# Patient Record
Sex: Male | Born: 1990
Health system: Southern US, Community
[De-identification: ages and names within clinical notes are randomized; demographics above are authoritative.]

## PROBLEM LIST (undated history)

## (undated) DIAGNOSIS — F1010 Alcohol abuse, uncomplicated: Secondary | ICD-10-CM

## (undated) DIAGNOSIS — F259 Schizoaffective disorder, unspecified: Secondary | ICD-10-CM

## (undated) DIAGNOSIS — F319 Bipolar disorder, unspecified: Secondary | ICD-10-CM

## (undated) DIAGNOSIS — F419 Anxiety disorder, unspecified: Secondary | ICD-10-CM

---
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
## 2018-05-07 ENCOUNTER — Ambulatory Visit (INDEPENDENT_AMBULATORY_CARE_PROVIDER_SITE_OTHER): Payer: 59 | Admitting: Licensed Clinical Social Worker

## 2018-05-07 DIAGNOSIS — F39 Unspecified mood [affective] disorder: Secondary | ICD-10-CM

## 2018-05-09 NOTE — Progress Notes (Signed)
Comprehensive Clinical Assessment (CCA) Note  05/09/2018 Brady Mckinney [id number]  Visit Diagnosis:      ICD-10-CM   1. Unspecified mood (affective) disorder (HCC) F39       CCA Part One  Part One has been completed on paper by the patient.  (See scanned document in Chart Review)  CCA Part Two A  Intake/Chief Complaint:  CCA Intake With Chief Complaint CCA Part Two Date: 05/07/18 CCA Part Two Time: 1633 Chief Complaint/Presenting Problem: "I don't really know--my mother had bipolar and my father thinks I may too. I've had a rough life." Patients Currently Reported Symptoms/Problems: sleep is restless, poor decision making, possible TBI Collateral Involvement: Sister is present in session Individual's Strengths: Hard working, lots of job experience, "easy to get a job" Individual's Preferences: "I don't think I want to try medications at this time." Individual's Abilities: Able bodied Type of Services Patient Feels Are Needed: "I don't know, probably individual counseling." Initial Clinical Notes/Concerns: Inability to discuss sxs, strong external locus of control  Mental Health Symptoms Depression:  Depression: Difficulty Concentrating, Sleep (too much or little)  Mania:     Anxiety:      Psychosis:     Trauma:     Obsessions:     Compulsions:     Inattention:  Inattention: Disorganized, Forgetful, Poor follow-through on tasks  Hyperactivity/Impulsivity:  Hyperactivity/Impulsivity: Always on the go, Feeling of restlessness, Fidgets with hands/feet, Hard time playing/leisure activities quietly, Talks excessively  Oppositional/Defiant Behaviors:     Borderline Personality:     Other Mood/Personality Symptoms:      Mental Status Exam Appearance and self-care  Stature:  Stature: Average  Weight:  Weight: Average weight  Clothing:  Clothing: Casual, Disheveled  Grooming:  Grooming: Neglected  Cosmetic use:  Cosmetic Use: None  Posture/gait:  Posture/Gait: Normal  Motor  activity:  Motor Activity: Agitated  Sensorium  Attention:  Attention: Distractible  Concentration:  Concentration: Scattered, Variable  Orientation:  Orientation: X5  Recall/memory:  Recall/Memory: Defective in Recent  Affect and Mood  Affect:  Affect: Appropriate  Mood:  Mood: Euthymic, Anxious  Relating  Eye contact:  Eye Contact: Fleeting  Facial expression:  Facial Expression: Constricted, Anxious  Attitude toward examiner:  Attitude Toward Examiner: Cooperative, Silly, Animal nutritionist and Language  Speech flow: Speech Flow: Blocked  Thought content:  Thought Content: Appropriate to mood and circumstances  Preoccupation:     Hallucinations:     Organization:     Company secretary of Knowledge:  Fund of Knowledge: Average  Intelligence:  Intelligence: Average  Abstraction:  Abstraction: Functional  Judgement:  Judgement: Dangerous, Poor  Reality Testing:  Reality Testing: Distorted  Insight:  Insight: Poor  Decision Making:  Decision Making: Impulsive, Confused  Social Functioning  Social Maturity:  Social Maturity: Self-centered, Isolates, Impulsive  Social Judgement:  Social Judgement: Victimized, "Chief of Staff"  Stress  Stressors:  Stressors: Family conflict, Transitions  Coping Ability:  Coping Ability: Deficient supports, Building surveyor Deficits:     Supports:      Family and Psychosocial History: Family history Marital status: Single Are you sexually active?: No What is your sexual orientation?: heterosexual Does patient have children?: No  Childhood History:  Childhood History By whom was/is the patient raised?: Father Additional childhood history information: "Mother was in-and-out of institutions for her severe bipolar since I was around 27yo." Description of patient's relationship with caregiver when they were a child: "Not a good relationship w/ mother, she's too  unpredictable" "Father was fine, we had to move for his job". Patient's  description of current relationship with people who raised him/her: "Don't talk to mother much". "See father occasionally throughout the year". Does patient have siblings?: Yes Number of Siblings: 1 Description of patient's current relationship with siblings: Good, "I'm living w/ my sister now and she is supportive". Did patient suffer any verbal/emotional/physical/sexual abuse as a child?: No Did patient suffer from severe childhood neglect?: Yes Patient description of severe childhood neglect: emotional neglect from mother due to her Bipolar dx Has patient ever been sexually abused/assaulted/raped as an adolescent or adult?: No Was the patient ever a victim of a crime or a disaster?: No Witnessed domestic violence?: No Has patient been effected by domestic violence as an adult?: No  CCA Part Two B  Employment/Work Situation: Employment / Work Situation Employment situation: Employed Where is patient currently employed?: [employer] How long has patient been employed?: "Since I was 27yo". Patient's job has been impacted by current illness: No What is the longest time patient has a held a job?: " A few years". Did You Receive Any Psychiatric Treatment/Services While in the Military?: No Are There Guns or Other Weapons in Your Home?: No  Education: Education School Currently Attending: "Wanting to go to [employer] this Fall" Last Grade Completed: 14 Name of High School: [employer] Did Garment/textile technologist From [employer]?: Yes Did You Attend College?: Yes What Type of College Degree Do you Have?: Associates What Was Your Major?: Food Science Did You Have Any Special Interests In School?: Cooking  Religion: Religion/Spirituality Are You A Religious Person?: No  Leisure/Recreation: Leisure / Recreation Leisure and Hobbies: netflix, video games, music  Exercise/Diet: Exercise/Diet Do You Exercise?: No Have You Gained or Lost A Significant Amount of Weight in the Past Six Months?: No Do You  Follow a Special Diet?: No Do You Have Any Trouble Sleeping?: No  CCA Part Two C  Alcohol/Drug Use: Alcohol / Drug Use History of alcohol / drug use?: Yes Substance #1 Name of Substance 1: Marijuana 1 - Age of First Use: 17 1 - Frequency: near daily 1 - Duration: 2 years 1 - Last Use / Amount: "A few months ago. I've stopped using it as much for my anxiety". Substance #2 Name of Substance 2: Alcohol 2 - Age of First Use: 17 2 - Amount (size/oz): 2-3 standard beers 2 - Frequency: weekly 2 - Duration: last few years 2 - Last Use / Amount: "A few weeks ago".                  CCA Part Three  ASAM's:  Six Dimensions of Multidimensional Assessment  Dimension 1:  Acute Intoxication and/or Withdrawal Potential:     Dimension 2:  Biomedical Conditions and Complications:     Dimension 3:  Emotional, Behavioral, or Cognitive Conditions and Complications:     Dimension 4:  Readiness to Change:     Dimension 5:  Relapse, Continued use, or Continued Problem Potential:     Dimension 6:  Recovery/Living Environment:      Substance use Disorder (SUD)    Social Function:  Social Functioning Social Maturity: Self-centered, Isolates, Impulsive Social Judgement: Victimized, Chemical engineer"  Stress:  Stress Stressors: Family conflict, Transitions Coping Ability: Deficient supports, Overwhelmed Patient Takes Medications The Way The Doctor Instructed?: No Priority Risk: Low Acuity  Risk Assessment- Self-Harm Potential: Risk Assessment For Self-Harm Potential Thoughts of Self-Harm: No current thoughts Method: No plan  Risk Assessment -Dangerous to Others Potential: Risk Assessment For Dangerous to Others Potential Method: No Plan  DSM5 Diagnoses: There are no active problems to display for this patient.   Patient Centered Plan: Patient is on the following Treatment Plan(s):  Impulse Control  Recommendations for Services/Supports/Treatments: Recommendations for  Services/Supports/Treatments Recommendations For Services/Supports/Treatments: Individual Therapy  Treatment Plan Summary:    Referrals to Alternative Service(s): Referred to Alternative Service(s):   Place:   Date:   Time:    Referred to Alternative Service(s):   Place:   Date:   Time:    Referred to Alternative Service(s):   Place:   Date:   Time:    Referred to Alternative Service(s):   Place:   Date:   Time:     Margo Common

## 2018-06-03 ENCOUNTER — Ambulatory Visit (INDEPENDENT_AMBULATORY_CARE_PROVIDER_SITE_OTHER): Payer: 59 | Admitting: Licensed Clinical Social Worker

## 2018-06-03 DIAGNOSIS — F39 Unspecified mood [affective] disorder: Secondary | ICD-10-CM

## 2018-06-04 ENCOUNTER — Encounter (HOSPITAL_COMMUNITY): Payer: Self-pay | Admitting: Licensed Clinical Social Worker
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
## 2018-07-01 ENCOUNTER — Ambulatory Visit (INDEPENDENT_AMBULATORY_CARE_PROVIDER_SITE_OTHER): Payer: Self-pay | Admitting: Licensed Clinical Social Worker

## 2018-07-01 DIAGNOSIS — F39 Unspecified mood [affective] disorder: Secondary | ICD-10-CM

## 2018-07-03 NOTE — Progress Notes (Signed)
Pt did not show for appointment. 

## 2019-01-16 ENCOUNTER — Other Ambulatory Visit: Payer: Self-pay

## 2019-01-16 ENCOUNTER — Encounter (HOSPITAL_COMMUNITY): Payer: Self-pay

## 2019-01-16 ENCOUNTER — Emergency Department (HOSPITAL_COMMUNITY)
Admission: EM | Admit: 2019-01-16 | Discharge: 2019-01-17 | Disposition: A | Discharge status: Other Acute Inpatient Hospital | Payer: Medicaid Other | Attending: Emergency Medicine | Admitting: Emergency Medicine

## 2019-01-16 DIAGNOSIS — F1220 Cannabis dependence, uncomplicated: Secondary | ICD-10-CM | POA: Insufficient documentation

## 2019-01-16 DIAGNOSIS — F1020 Alcohol dependence, uncomplicated: Secondary | ICD-10-CM | POA: Insufficient documentation

## 2019-01-16 DIAGNOSIS — F332 Major depressive disorder, recurrent severe without psychotic features: Secondary | ICD-10-CM | POA: Insufficient documentation

## 2019-01-16 DIAGNOSIS — R45851 Suicidal ideations: Secondary | ICD-10-CM

## 2019-01-16 DIAGNOSIS — F17210 Nicotine dependence, cigarettes, uncomplicated: Secondary | ICD-10-CM | POA: Insufficient documentation

## 2019-01-16 DIAGNOSIS — F10920 Alcohol use, unspecified with intoxication, uncomplicated: Secondary | ICD-10-CM

## 2019-01-16 HISTORY — DX: Bipolar disorder, unspecified: F31.9

## 2019-01-16 HISTORY — DX: Anxiety disorder, unspecified: F41.9

## 2019-01-16 HISTORY — DX: Schizoaffective disorder, unspecified: F25.9

## 2019-01-16 LAB — COMPREHENSIVE METABOLIC PANEL
ALK PHOS: 86 U/L (ref 38–126)
ALT: 36 U/L (ref 0–44)
ANION GAP: 11 (ref 5–15)
AST: 61 U/L — ABNORMAL HIGH (ref 15–41)
Albumin: 4.8 g/dL (ref 3.5–5.0)
BUN: 5 mg/dL — ABNORMAL LOW (ref 6–20)
CALCIUM: 9 mg/dL (ref 8.9–10.3)
CHLORIDE: 103 mmol/L (ref 98–111)
CO2: 26 mmol/L (ref 22–32)
CREATININE: 0.77 mg/dL (ref 0.61–1.24)
Glucose, Bld: 179 mg/dL — ABNORMAL HIGH (ref 70–99)
Potassium: 3.7 mmol/L (ref 3.5–5.1)
SODIUM: 140 mmol/L (ref 135–145)
Total Bilirubin: 0.5 mg/dL (ref 0.3–1.2)
Total Protein: 8 g/dL (ref 6.5–8.1)

## 2019-01-16 LAB — CBC
HCT: 51.2 % (ref 39.0–52.0)
Hemoglobin: 16.6 g/dL (ref 13.0–17.0)
MCH: 32.2 pg (ref 26.0–34.0)
MCHC: 32.4 g/dL (ref 30.0–36.0)
MCV: 99.2 fL (ref 80.0–100.0)
Platelets: 288 10*3/uL (ref 150–400)
RBC: 5.16 MIL/uL (ref 4.22–5.81)
RDW: 13.2 % (ref 11.5–15.5)
WBC: 9.3 10*3/uL (ref 4.0–10.5)
nRBC: 0 % (ref 0.0–0.2)

## 2019-01-16 LAB — RAPID URINE DRUG SCREEN, HOSP PERFORMED
Amphetamines: NOT DETECTED
Barbiturates: NOT DETECTED
Benzodiazepines: NOT DETECTED
Cocaine: NOT DETECTED
OPIATES: NOT DETECTED
Tetrahydrocannabinol: POSITIVE — AB

## 2019-01-16 LAB — ETHANOL: ALCOHOL ETHYL (B): 453 mg/dL — AB (ref <10)

## 2019-01-16 LAB — ACETAMINOPHEN LEVEL

## 2019-01-16 LAB — SALICYLATE LEVEL

## 2019-01-16 MED ORDER — LORAZEPAM 1 MG PO TABS: 0.0000 mg | ORAL_TABLET | Freq: Four times a day (QID) | ORAL | Status: DC

## 2019-01-16 MED ORDER — VITAMIN B-1 100 MG PO TABS: 100.0000 mg | ORAL_TABLET | Freq: Every day | ORAL | Status: DC

## 2019-01-16 MED ORDER — LORAZEPAM 2 MG/ML IJ SOLN: 0.0000 mg | Freq: Two times a day (BID) | INTRAMUSCULAR | Status: DC

## 2019-01-16 MED ORDER — THIAMINE HCL 100 MG/ML IJ SOLN: 100.0000 mg | Freq: Every day | INTRAMUSCULAR | Status: DC

## 2019-01-16 MED ORDER — LORAZEPAM 1 MG PO TABS: 0.0000 mg | ORAL_TABLET | Freq: Two times a day (BID) | ORAL | Status: DC

## 2019-01-16 MED ORDER — LORAZEPAM 2 MG/ML IJ SOLN: 0.0000 mg | Freq: Four times a day (QID) | INTRAMUSCULAR | Status: DC

## 2019-01-16 NOTE — ED Provider Notes (Addendum)
[facility]-EMERGENCY DEPT Provider Note   CSN: [id number] Arrival date & time: 01/16/19  1830     History   Chief Complaint Chief Complaint  Patient presents with  . Suicidal    HPI Brady Mckinney is a 28 y.o. male.  HPI Patient presents with suicidal thoughts.  States he is close to doing it.  States he has been suicidal since he was 28 years old.  Has had previous attempts.  States he has been off his psychiatric medicine.  States is been a bunch from before but they were not working.  Denies substance abuse.  States he will drink 140 ounce beers a week.  However he does smell of alcohol at this time.  Patient is homeless and has been out in the rain.  Patient has several different plans to kill himself.  States he is worried that he will do it. Past Medical History:  Diagnosis Date  . Anxiety   . Bipolar 1 disorder (HCC)   . Schizoaffective disorder [facility](HCC)     Patient Active Problem List   Diagnosis Date Noted  . MDD (major depressive disorder), severe (HCC) 01/17/2019    History reviewed. No pertinent surgical history.      Home Medications    Prior to Admission medications   Not on File    Family History Family History  Problem Relation Age of Onset  . Bipolar disorder Mother   . Schizophrenia Father     Social History Social History   Tobacco Use  . Smoking status: Current Every Day Smoker    Packs/day: 0.45    Types: Cigarettes  . Smokeless tobacco: Never Used  Substance Use Topics  . Alcohol use: Yes    Comment: daily   . Drug use: Never     Allergies   Patient has no known allergies.   Review of Systems Review of Systems  Constitutional: Positive for appetite change.  HENT: Negative for congestion.   Respiratory: Negative for shortness of breath.   Cardiovascular: Negative for chest pain.  Gastrointestinal: Negative for abdominal distention.  Genitourinary: Negative for flank pain.  Musculoskeletal: Negative  for back pain.  Skin: Negative for rash.  Neurological: Negative for weakness.  Psychiatric/Behavioral: Positive for suicidal ideas.     Physical Exam Updated Vital Signs BP 119/81 (BP Location: Left Arm)   Pulse (!) 101   Temp 97.7 F (36.5 C) (Oral)   Resp 18   Ht 5\' 8"  (1.727 m)   Wt 65.8 kg   SpO2 100%   BMI 22.05 kg/m   Physical Exam Vitals signs and nursing note reviewed.  HENT:     Head: Normocephalic.  Eyes:     Extraocular Movements: Extraocular movements intact.  Cardiovascular:     Rate and Rhythm: Regular rhythm.     Comments: Mild tachycardia Pulmonary:     Effort: Pulmonary effort is normal.  Abdominal:     Tenderness: There is no abdominal tenderness.  Skin:    Capillary Refill: Capillary refill takes less than 2 seconds.     Comments: Patient's feet appears if they have been wet for a while.  No clear infections.  Neurological:     Comments: Patient appears intoxicated.  Mildly slurred speech.  Psychiatric:     Comments: Patient appears intoxicated.      ED Treatments / Results  Labs (all labs ordered are listed, but only abnormal results are displayed) Labs Reviewed  COMPREHENSIVE METABOLIC PANEL - Abnormal; Notable for  the following components:      Result Value   Glucose, Bld 179 (*)    BUN 5 (*)    AST 61 (*)    All other components within normal limits  ETHANOL - Abnormal; Notable for the following components:   Alcohol, Ethyl (B) 453 (*)    All other components within normal limits  ACETAMINOPHEN LEVEL - Abnormal; Notable for the following components:   Acetaminophen (Tylenol), Serum <10 (*)    All other components within normal limits  RAPID URINE DRUG SCREEN, HOSP PERFORMED - Abnormal; Notable for the following components:   Tetrahydrocannabinol POSITIVE (*)    All other components within normal limits  SALICYLATE LEVEL  CBC    EKG None  Radiology No results found.  Procedures Procedures (including critical care  time)  Medications Ordered in ED Medications - No data to display   Initial Impression / Assessment and Plan / ED Course  I have reviewed the triage vital signs and the nursing notes.  Pertinent labs & imaging results that were available during my care of the patient were reviewed by me and considered in my medical decision making (see chart for details).  Clinical Course as of Jan 21 708  Fri Jan 17, 2019  0043 [facility]   [MB]    Clinical Course User Index [MB] Sabas SousBero, Michael M, MD    Patient with suicidal thoughts.  Psychiatric history and office medicines.  States he only drinks one 40 ounce beer a week but alcohol level is 450.  Besides the alcohol level he is medically cleared.  To be seen by TTS.  Final Clinical Impressions(s) / ED Diagnoses   Final diagnoses:  Suicidal ideation  Alcoholic intoxication without complication [facility](HCC)    ED Discharge Orders    None       Benjiman CorePickering, Simcha Speir, MD 01/16/19 2134    Benjiman CorePickering, Carley Glendenning, MD 01/17/19 1530    Benjiman CorePickering, Cayson Kalb, MD 01/20/19 [id number]

## 2019-01-16 NOTE — ED Notes (Signed)
TTS at bedside. 

## 2019-01-16 NOTE — ED Notes (Signed)
Bed: [facility]4 Expected date:  Expected time:  Means of arrival:  Comments: 

## 2019-01-16 NOTE — ED Triage Notes (Addendum)
Patient states he is suicidal. Patient states his plan is to jump off of a building, jumping out of a plane with no parachute, or causing myself to get run over by truck tires. Patient states, "I do not want to do these things. I am looking for help." Patient denies any HI, drug use. Patient states he has visualizations of sexual things or actually killing himself. Patient states he drank a 40 ounce beer at 1400 today.

## 2019-01-17 ENCOUNTER — Other Ambulatory Visit: Payer: Self-pay

## 2019-01-17 ENCOUNTER — Inpatient Hospital Stay (HOSPITAL_COMMUNITY)
Admission: AD | Admit: 2019-01-17 | Discharge: 2019-01-23 | DRG: 885 | Disposition: A | Discharge status: Home / Self Care | Payer: Medicaid Other | Source: Intra-hospital | Attending: Psychiatry | Admitting: Psychiatry

## 2019-01-17 ENCOUNTER — Encounter (HOSPITAL_COMMUNITY): Payer: Self-pay | Admitting: Behavioral Health

## 2019-01-17 DIAGNOSIS — F3181 Bipolar II disorder: Secondary | ICD-10-CM | POA: Diagnosis not present

## 2019-01-17 DIAGNOSIS — Y908 Blood alcohol level of 240 mg/100 ml or more: Secondary | ICD-10-CM | POA: Diagnosis present

## 2019-01-17 DIAGNOSIS — F17210 Nicotine dependence, cigarettes, uncomplicated: Secondary | ICD-10-CM | POA: Diagnosis present

## 2019-01-17 DIAGNOSIS — Z818 Family history of other mental and behavioral disorders: Secondary | ICD-10-CM | POA: Diagnosis not present

## 2019-01-17 DIAGNOSIS — R45851 Suicidal ideations: Secondary | ICD-10-CM | POA: Diagnosis present

## 2019-01-17 DIAGNOSIS — F319 Bipolar disorder, unspecified: Secondary | ICD-10-CM | POA: Diagnosis present

## 2019-01-17 DIAGNOSIS — F10239 Alcohol dependence with withdrawal, unspecified: Secondary | ICD-10-CM | POA: Diagnosis present

## 2019-01-17 DIAGNOSIS — F259 Schizoaffective disorder, unspecified: Secondary | ICD-10-CM | POA: Diagnosis present

## 2019-01-17 DIAGNOSIS — Z590 Homelessness: Secondary | ICD-10-CM | POA: Diagnosis not present

## 2019-01-17 DIAGNOSIS — F1024 Alcohol dependence with alcohol-induced mood disorder: Secondary | ICD-10-CM

## 2019-01-17 DIAGNOSIS — F1010 Alcohol abuse, uncomplicated: Secondary | ICD-10-CM

## 2019-01-17 DIAGNOSIS — G4700 Insomnia, unspecified: Secondary | ICD-10-CM | POA: Diagnosis present

## 2019-01-17 DIAGNOSIS — F322 Major depressive disorder, single episode, severe without psychotic features: Secondary | ICD-10-CM | POA: Diagnosis present

## 2019-01-17 MED ORDER — LORAZEPAM 1 MG PO TABS
1.0000 mg | ORAL_TABLET | Freq: Two times a day (BID) | ORAL | Status: AC
  Administered 2019-01-19 (×2): 1 mg via ORAL
  Filled 2019-01-17 (×2): qty 1

## 2019-01-17 MED ORDER — LORAZEPAM 1 MG PO TABS
1.0000 mg | ORAL_TABLET | Freq: Every day | ORAL | Status: AC
  Administered 2019-01-20: 1 mg via ORAL
  Filled 2019-01-17: qty 1

## 2019-01-17 MED ORDER — LORAZEPAM 1 MG PO TABS
1.0000 mg | ORAL_TABLET | Freq: Four times a day (QID) | ORAL | Status: AC
  Administered 2019-01-17 (×3): 1 mg via ORAL
  Filled 2019-01-17 (×3): qty 1

## 2019-01-17 MED ORDER — NICOTINE 21 MG/24HR TD PT24
21.0000 mg | MEDICATED_PATCH | TRANSDERMAL | Status: DC
  Administered 2019-01-18: 21 mg via TRANSDERMAL
  Filled 2019-01-17 (×3): qty 1

## 2019-01-17 MED ORDER — THIAMINE HCL 100 MG/ML IJ SOLN: 100.0000 mg | Freq: Once | INTRAMUSCULAR | Status: DC

## 2019-01-17 MED ORDER — TRAZODONE HCL 50 MG PO TABS
50.0000 mg | ORAL_TABLET | Freq: Every evening | ORAL | Status: DC | PRN
  Filled 2019-01-17 (×4): qty 1

## 2019-01-17 MED ORDER — LOPERAMIDE HCL 2 MG PO CAPS: 2.0000 mg | ORAL_CAPSULE | ORAL | Status: AC | PRN

## 2019-01-17 MED ORDER — HYDROXYZINE HCL 25 MG PO TABS
25.0000 mg | ORAL_TABLET | Freq: Four times a day (QID) | ORAL | Status: AC | PRN
  Administered 2019-01-17 – 2019-01-19 (×4): 25 mg via ORAL
  Filled 2019-01-17 (×4): qty 1

## 2019-01-17 MED ORDER — MAGNESIUM HYDROXIDE 400 MG/5ML PO SUSP: 30.0000 mL | Freq: Every day | ORAL | Status: DC | PRN

## 2019-01-17 MED ORDER — VITAMIN B-1 100 MG PO TABS
100.0000 mg | ORAL_TABLET | Freq: Every day | ORAL | Status: DC
  Administered 2019-01-18 – 2019-01-23 (×6): 100 mg via ORAL
  Filled 2019-01-17 (×8): qty 1

## 2019-01-17 MED ORDER — ACETAMINOPHEN 325 MG PO TABS: 650.0000 mg | ORAL_TABLET | Freq: Four times a day (QID) | ORAL | Status: DC | PRN

## 2019-01-17 MED ORDER — LORAZEPAM 1 MG PO TABS
1.0000 mg | ORAL_TABLET | Freq: Four times a day (QID) | ORAL | Status: AC | PRN
  Administered 2019-01-19: 1 mg via ORAL
  Filled 2019-01-17: qty 1

## 2019-01-17 MED ORDER — IBUPROFEN 600 MG PO TABS
600.0000 mg | ORAL_TABLET | Freq: Four times a day (QID) | ORAL | Status: DC | PRN
  Administered 2019-01-17 – 2019-01-22 (×4): 600 mg via ORAL
  Filled 2019-01-17 (×4): qty 1

## 2019-01-17 MED ORDER — LORAZEPAM 1 MG PO TABS
1.0000 mg | ORAL_TABLET | Freq: Three times a day (TID) | ORAL | Status: AC
  Administered 2019-01-18 (×3): 1 mg via ORAL
  Filled 2019-01-17 (×3): qty 1

## 2019-01-17 MED ORDER — ADULT MULTIVITAMIN W/MINERALS CH
1.0000 | ORAL_TABLET | Freq: Every day | ORAL | Status: DC
  Administered 2019-01-17 – 2019-01-23 (×7): 1 via ORAL
  Filled 2019-01-17 (×10): qty 1

## 2019-01-17 MED ORDER — TRAZODONE HCL 50 MG PO TABS
50.0000 mg | ORAL_TABLET | Freq: Every evening | ORAL | Status: DC | PRN
  Administered 2019-01-17 – 2019-01-21 (×5): 50 mg via ORAL
  Filled 2019-01-17 (×4): qty 1

## 2019-01-17 MED ORDER — ALUM & MAG HYDROXIDE-SIMETH 200-200-20 MG/5ML PO SUSP: 30.0000 mL | ORAL | Status: DC | PRN

## 2019-01-17 MED ORDER — ONDANSETRON 4 MG PO TBDP
4.0000 mg | ORAL_TABLET | Freq: Four times a day (QID) | ORAL | Status: AC | PRN
  Administered 2019-01-18 – 2019-01-19 (×2): 4 mg via ORAL
  Filled 2019-01-17 (×3): qty 1

## 2019-01-17 MED ORDER — CARBAMAZEPINE ER 200 MG PO TB12
200.0000 mg | ORAL_TABLET | Freq: Two times a day (BID) | ORAL | Status: DC
  Administered 2019-01-17 – 2019-01-20 (×6): 200 mg via ORAL
  Filled 2019-01-17 (×12): qty 1

## 2019-01-17 NOTE — ED Notes (Signed)
Attempted report. Patrice to [employer]
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
## 2019-01-17 NOTE — BHH Suicide Risk Assessment (Signed)
[facility] Admission Suicide Risk Assessment   Nursing information obtained from:  Patient Demographic factors:  Male, Caucasian, Low socioeconomic status, Unemployed Current Mental Status:  Suicidal ideation indicated by patient, Suicide plan Loss Factors:  Decrease in vocational status Historical Factors:  Prior suicide attempts, Impulsivity Risk Reduction Factors:  NA  Total Time spent with patient: 45 minutes Principal Problem:  Bipolar Disorder Depressed, versus Alcohol Induced Mood Disorder, Alcohol Dependence  Diagnosis:  Active Problems:   MDD (major depressive disorder), severe (HCC)  Subjective Data:   Continued Clinical Symptoms:  Alcohol Use Disorder Identification Test Final Score (AUDIT): 10 The "Alcohol Use Disorders Identification Test", Guidelines for Use in Primary Care, Second Edition.  World Science writer [facility]). Score between 0-7:  no or low risk or alcohol related problems. Score between 8-15:  moderate risk of alcohol related problems. Score between 16-19:  high risk of alcohol related problems. Score 20 or above:  warrants further diagnostic evaluation for alcohol dependence and treatment.   CLINICAL FACTORS:  28 year old male, presented to ED voluntarily due to worsening depression, suicidal ideations, neuro-vegetative symptoms of depression. Also reported daily drinking over the last few weeks and admission BAL was 453. He reports history of Bipolar Disorder and does endorse past history of depressive and hypomanic episodes , as well as family history of Bipolarity ( mother).   Psychiatric Specialty Exam: Physical Exam  ROS  Blood pressure 120/74, pulse 99, temperature 98.2 F (36.8 C), temperature source Oral, resp. rate 16, height 5\' 8"  (1.727 m), weight 65.8 kg, SpO2 98 %.Body mass index is 22.05 kg/m.  See admit note MSE                                                        COGNITIVE FEATURES THAT CONTRIBUTE TO RISK:   Closed-mindedness and Loss of executive function    SUICIDE RISK:   Moderate:  Frequent suicidal ideation with limited intensity, and duration, some specificity in terms of plans, no associated intent, good self-control, limited dysphoria/symptomatology, some risk factors present, and identifiable protective factors, including available and accessible social support.  PLAN OF CARE: Patient will be admitted to inpatient psychiatric unit for stabilization and safety. Will provide and encourage milieu participation. Provide medication management and maked adjustments as needed. Will also provide medication management to address potential alcohol WDL.  Will follow daily.    I certify that inpatient services furnished can reasonably be expected to improve the patient's condition.   Craige Cotta, MD 01/17/2019, 4:43 PM

## 2019-01-17 NOTE — Progress Notes (Signed)
D: Pt was in dayroom upon initial approach.  Pt presents with anxious, depressed affect and mood.  He describes his day as "decent" and reports his goal is to "try to sleep."  Pt denies HI, denies hallucinations, reports pain from headache of 3/10.  He reports passive SI without a plan.  Pt verbally contracts for safety.  Pt has been visible in milieu interacting with peers and staff appropriately.  Pt attended evening group.    A: Introduced self to pt.  Actively listened to pt and offered support and encouragement.  Medications administered per order.  PRN medication administered for anxiety, pain, and sleep.  Q15 minute safety checks maintained.  R: Pt is safe on the unit.  Pt is compliant with medications.  Pt verbally contracts for safety.  Will continue to monitor and assess.

## 2019-01-17 NOTE — ED Notes (Signed)
Pt transported via [facility] to [facility] with belongings.

## 2019-01-17 NOTE — Tx Team (Signed)
Initial Treatment Plan 01/17/2019 11:07 AM Brady Mckinney KDX:[id number]    PATIENT STRESSORS: Legal issue Substance abuse   PATIENT STRENGTHS: Ability for insight Active sense of humor Average or above average intelligence   PATIENT IDENTIFIED PROBLEMS: "thoughts to attempt suicide"  "employent concerns due to mental health"  "drinking alcohol excessively"                 DISCHARGE CRITERIA:  Ability to meet basic life and health needs Adequate post-discharge living arrangements Improved stabilization in mood, thinking, and/or behavior  PRELIMINARY DISCHARGE PLAN: Attend aftercare/continuing care group Attend PHP/IOP  PATIENT/FAMILY INVOLVEMENT: This treatment plan has been presented to and reviewed with the patient, Brady Mckinney, and/or family member.  The patient and family have been given the opportunity to ask questions and make suggestions.  Layla Barter, RN 01/17/2019, 11:07 AM

## 2019-01-17 NOTE — ED Notes (Signed)
Bed: [facility] Expected date:  Expected time:  Means of arrival:  Comments: 

## 2019-01-17 NOTE — ED Notes (Signed)
Pt seen and wand by security.  Pt has belongings at  Triage desk.

## 2019-01-17 NOTE — H&P (Signed)
Psychiatric Admission Assessment Adult  Patient Identification: Brady Mckinney MRN:  [id number] Date of Evaluation:  01/17/2019 Chief Complaint:  " I really felt like killing myself " Principal Diagnosis: MDD Diagnosis:  Active Problems:   MDD (major depressive disorder), severe (HCC)  History of Present Illness: 28 year old male, presented to ED voluntarily on 2/6, reporting worsening depression, feeling hopeless, and having suicidal ideations, with thoughts of jumping in front of a truck. He reports history of intermittent depression since childhood, but states he has been more depressed over the last few weeks, and states he has been facing some significant stressors over recent weeks- homelessness, recent break up, losing  job recently, recent anniversary of prior GF's death , who died by overdose 2 year ago. He has also been drinking daily, usually about 4 beers ( 48 ounces ) per day.  Admission BAL 453, admission UDS (+) for Cannabis Endorses neuro-vegetative symptoms as below Associated Signs/Symptoms: Depression Symptoms:  depressed mood, anhedonia, insomnia, suicidal thoughts with specific plan, anxiety, loss of energy/fatigue, decreased appetite, reports he has lost about 20 lbs over the last 2 months (Hypo) Manic Symptoms:  Does not endorse or present with at this time Anxiety Symptoms:  Reports increased anxiety, worry, which he attributes to recent losses as above  Psychotic Symptoms:  Denies  PTSD Symptoms: Does not endorse  Total Time spent with patient: 45 minutes  Past Psychiatric History:  No prior psychiatric admissions, history of prior suicide attempts as teenager by overdosing , walking into traffic, most recently at age 25. Reports prior history of Bipolar Disorder and reports history of intermittent depressive episodes and brief episodes of feeling more energetic, jovial, feeling hypersexual and more sociable, decreased need for sleep. Denies history of panic  or agoraphobia. Denies history of psychosis Denies history of violence .  Is the patient at risk to self? Yes.    Has the patient been a risk to self in the past 6 months? Yes.    Has the patient been a risk to self within the distant past? Yes.    Is the patient a risk to others? No.  Has the patient been a risk to others in the past 6 months? No.  Has the patient been a risk to others within the distant past? No.   Prior Inpatient Therapy:  as above  Prior Outpatient Therapy:  no current outpatient treatment   Alcohol Screening: 1. How often do you have a drink containing alcohol?: 2 to 3 times a week 2. How many drinks containing alcohol do you have on a typical day when you are drinking?: 3 or 4 3. How often do you have six or more drinks on one occasion?: Monthly AUDIT-C Score: 6 4. How often during the last year have you found that you were not able to stop drinking once you had started?: Never 5. How often during the last year have you failed to do what was normally expected from you becasue of drinking?: Never 6. How often during the last year have you needed a first drink in the morning to get yourself going after a heavy drinking session?: Never 7. How often during the last year have you had a feeling of guilt of remorse after drinking?: Never 8. How often during the last year have you been unable to remember what happened the night before because you had been drinking?: Never 9. Have you or someone else been injured as a result of your drinking?: No 10. Has  a relative or friend or a doctor or another health worker been concerned about your drinking or suggested you cut down?: Yes, during the last year Alcohol Use Disorder Identification Test Final Score (AUDIT): 10 Alcohol Brief Interventions/Follow-up: Alcohol Education Substance Abuse History in the last 12 months:  Reports he has been drinking regularly, particularly over the last several weeks following a break up. Has been  drinking daily, up to 4 beers per day. Denies drug abuse .  Consequences of Substance Abuse: History of DUI in 2014. Denies history of seizures, reports past history of blackouts, none recent . Previous Psychotropic Medications:reports he was prescribed Seroquel in the past, " because they thought I had Bipolar", but states " it made me feel worse". He also remembers Depakote ( did not work) and Abilify.  Has not been on any psychiatric medications in more than a year . Psychological Evaluations: No  Past Medical History: Denies medical illnesses, NKDA.  Past Medical History:  Diagnosis Date  . Anxiety   . Bipolar 1 disorder (HCC)   . Schizoaffective disorder (HCC)    History reviewed. No pertinent surgical history. Family History: Parents alive, divorced when patient was very young. Has one older sister  Family Psychiatric  History: Patient states mother has been diagnosed with Bipolar Disorder and with Generalized Anxiety Disorder in the past. He has a maternal uncle who is alcoholic. No suicides in family . Of note, states that his mother has reportedly done very well on Tegretol .  Tobacco Screening: Have you used any form of tobacco in the last 30 days? (Cigarettes, Smokeless Tobacco, Cigars, and/or Pipes): Yes Tobacco use, Select all that apply: 5 or more cigarettes per day Are you interested in Tobacco Cessation Medications?: Yes, will notify MD for an order Counseled patient on smoking cessation including recognizing danger situations, developing coping skills and basic information about quitting provided: Yes Social History: single, no children, currently homeless, reports recent break up with GF. Currently unemployed. Reports recent court case for open container charge . Social History   Substance and Sexual Activity  Alcohol Use Yes   Comment: daily      Social History   Substance and Sexual Activity  Drug Use Never    Additional Social History:  Allergies:  No Known  Allergies Lab Results:  Results for orders placed or performed during the hospital encounter of 01/16/19 (from the past 48 hour(s))  Comprehensive metabolic panel     Status: Abnormal   Collection Time: 01/16/19  7:33 PM  Result Value Ref Range   Sodium 140 135 - 145 mmol/L   Potassium 3.7 3.5 - 5.1 mmol/L   Chloride 103 98 - 111 mmol/L   CO2 26 22 - 32 mmol/L   Glucose, Bld 179 (H) 70 - 99 mg/dL   BUN 5 (L) 6 - 20 mg/dL   Creatinine, Ser 4.09 0.61 - 1.24 mg/dL   Calcium 9.0 8.9 - 81.1 mg/dL   Total Protein 8.0 6.5 - 8.1 g/dL   Albumin 4.8 3.5 - 5.0 g/dL   AST 61 (H) 15 - 41 U/L   ALT 36 0 - 44 U/L   Alkaline Phosphatase 86 38 - 126 U/L   Total Bilirubin 0.5 0.3 - 1.2 mg/dL   GFR calc non Af Amer >60 >60 mL/min   GFR calc Af Amer >60 >60 mL/min   Anion gap 11 5 - 15    Comment: Performed at [facility], [address] [place], [place]  [id number]  cbc     Status: None   Collection Time: 01/16/19  7:33 PM  Result Value Ref Range   WBC 9.3 4.0 - 10.5 K/uL   RBC 5.16 4.22 - 5.81 MIL/uL   Hemoglobin 16.6 13.0 - 17.0 g/dL   HCT 95.151.2 88.439.0 - 16.652.0 %   MCV 99.2 80.0 - 100.0 fL   MCH 32.2 26.0 - 34.0 pg   MCHC 32.4 30.0 - 36.0 g/dL   RDW 06.313.2 01.611.5 - 01.015.5 %   Platelets 288 150 - 400 K/uL   nRBC 0.0 0.0 - 0.2 %    Comment: Performed at [facility], [address] [place], [place] [address]  Ethanol     Status: Abnormal   Collection Time: 01/16/19  7:34 PM  Result Value Ref Range   Alcohol, Ethyl (B) 453 (HH) <10 mg/dL    Comment: CRITICAL RESULT CALLED TO, READ BACK BY AND VERIFIED WITH: Talbert NanJ OXENDINE RN 2007 01/16/19 A NAVARRO (NOTE) Lowest detectable limit for serum alcohol is 10 mg/dL. For medical purposes only. Performed at [facility], [address] [place], [place] [address]   Salicylate level     Status: None   Collection Time: 01/16/19  7:34 PM  Result Value Ref Range   Salicylate Lvl <7.0 2.8 - 30.0 mg/dL     Comment: Performed at [facility], [address] [place], [place] [address]  Acetaminophen level     Status: Abnormal   Collection Time: 01/16/19  7:34 PM  Result Value Ref Range   Acetaminophen (Tylenol), Serum <10 (L) 10 - 30 ug/mL    Comment: (NOTE) Therapeutic concentrations vary significantly. A range of 10-30 ug/mL  may be an effective concentration for many patients. However, some  are best treated at concentrations outside of this range. Acetaminophen concentrations >150 ug/mL at 4 hours after ingestion  and >50 ug/mL at 12 hours after ingestion are often associated with  toxic reactions. Performed at [facility], [address] [place], [place] [address]   Rapid urine drug screen (hospital performed)     Status: Abnormal   Collection Time: 01/16/19  8:41 PM  Result Value Ref Range   Opiates NONE DETECTED NONE DETECTED   Cocaine NONE DETECTED NONE DETECTED   Benzodiazepines NONE DETECTED NONE DETECTED   Amphetamines NONE DETECTED NONE DETECTED   Tetrahydrocannabinol POSITIVE (A) NONE DETECTED   Barbiturates NONE DETECTED NONE DETECTED    Comment: (NOTE) DRUG SCREEN FOR MEDICAL PURPOSES ONLY.  IF CONFIRMATION IS NEEDED FOR ANY PURPOSE, NOTIFY LAB WITHIN 5 DAYS. LOWEST DETECTABLE LIMITS FOR URINE DRUG SCREEN Drug Class                     Cutoff (ng/mL) Amphetamine and metabolites    1000 Barbiturate and metabolites    200 Benzodiazepine                 200 Tricyclics and metabolites     300 Opiates and metabolites        300 Cocaine and metabolites        300 THC                            50 Performed at [facility], [address] [place], [place] [address]     Blood Alcohol level:  Lab Results  Component Value Date   ETH 453 [facility](HH)  01/16/2019    Metabolic Disorder Labs:  No results found for: HGBA1C, MPG No results found for: PROLACTIN No results found for: CHOL, TRIG, HDL, CHOLHDL, VLDL,  LDLCALC  Current Medications: Current Facility-Administered Medications  Medication Dose Route Frequency Provider Last Rate Last Dose  . acetaminophen (TYLENOL) tablet 650 mg  650 mg Oral Q6H PRN Kerry Hough, PA-C      . alum & mag hydroxide-simeth (MAALOX/MYLANTA) 200-200-20 MG/5ML suspension 30 mL  30 mL Oral Q4H PRN Kerry Hough, PA-C      . hydrOXYzine (ATARAX/VISTARIL) tablet 25 mg  25 mg Oral Q6H PRN Donell Sievert E, PA-C      . loperamide (IMODIUM) capsule 2-4 mg  2-4 mg Oral PRN Kerry Hough, PA-C      . LORazepam (ATIVAN) tablet 1 mg  1 mg Oral Q6H PRN Kerry Hough, PA-C      . LORazepam (ATIVAN) tablet 1 mg  1 mg Oral QID Donell Sievert E, PA-C   1 mg at 01/17/19 1140   Followed by  . [START ON 01/18/2019] LORazepam (ATIVAN) tablet 1 mg  1 mg Oral TID Kerry Hough, PA-C       Followed by  . [START ON 01/19/2019] LORazepam (ATIVAN) tablet 1 mg  1 mg Oral BID Donell Sievert E, PA-C       Followed by  . [START ON 01/20/2019] LORazepam (ATIVAN) tablet 1 mg  1 mg Oral Daily Simon, Spencer E, PA-C      . magnesium hydroxide (MILK OF MAGNESIA) suspension 30 mL  30 mL Oral Daily PRN Kerry Hough, PA-C      . multivitamin with minerals tablet 1 tablet  1 tablet Oral Daily Kerry Hough, PA-C   1 tablet at 01/17/19 1140  . ondansetron (ZOFRAN-ODT) disintegrating tablet 4 mg  4 mg Oral Q6H PRN Donell Sievert E, PA-C      . thiamine (B-1) injection 100 mg  100 mg Intramuscular Once Kerry Hough, PA-C      . [START ON 01/18/2019] thiamine (VITAMIN B-1) tablet 100 mg  100 mg Oral Daily Simon, Spencer E, PA-C      . traZODone (DESYREL) tablet 50 mg  50 mg Oral QHS,MR X 1 Simon, Spencer E, PA-C       PTA Medications: No medications prior to admission.    Musculoskeletal: Strength & Muscle Tone: within normal limits minimal tremors, no diaphoresis, no psychomotor agitation Gait & Station: normal Patient leans: N/A  Psychiatric Specialty Exam: Physical Exam   Review of Systems  Constitutional: Positive for weight loss.  HENT: Negative.   Eyes: Negative.   Respiratory: Negative.   Cardiovascular: Negative.   Gastrointestinal: Positive for nausea and vomiting.  Genitourinary: Negative.   Musculoskeletal: Negative.   Skin: Negative.   Neurological: Positive for headaches. Negative for seizures.  Endo/Heme/Allergies: Negative.   Psychiatric/Behavioral: Positive for depression, substance abuse and suicidal ideas.  All other systems reviewed and are negative.   Blood pressure 120/74, pulse 99, temperature 98.2 F (36.8 C), temperature source Oral, resp. rate 16, height 5\' 8"  (1.727 m), weight 65.8 kg, SpO2 98 %.Body mass index is 22.05 kg/m.  General Appearance: Fairly Groomed  Eye Contact:  Fair  Speech:  Normal Rate  Volume:  Normal  Mood:  Depressed  Affect:  constricted   Thought Process:  Linear and Descriptions of Associations: Intact  Orientation:  Full (Time, Place, and Person)  Thought Content:  no hallucinations, no delusions, not  internally preoccupied   Suicidal Thoughts:  No denies current suicidal or self injurious ideations, contracts for safety on unit, denies homicidal or violent ideations   Homicidal Thoughts:  No  Memory:  recent and remote grossly intact  Judgement:  Fair  Insight:  Fair  Psychomotor Activity:  Normal- no psychomotor agitation, mild distal tremors  Concentration:  Concentration: Good and Attention Span: Good  Recall:  Good  Fund of Knowledge:  Good  Language:  Good  Akathisia:  Negative  Handed:  Right  AIMS (if indicated):     Assets:  Communication Skills Desire for Improvement Resilience  ADL's:  Intact  Cognition:  WNL  Sleep:       Treatment Plan Summary: Daily contact with patient to assess and evaluate symptoms and progress in treatment, Medication management, Plan inpatient treatment and medications as below  Observation Level/Precautions:  15 minute checks  Laboratory:  as  needed  TSH  Psychotherapy:  Milieu, group therapy   Medications:  Has been started Ativan detox protocol to address possible alcohol WDL symptoms. Patient expresses interest in Tegretol trial, which he states has been well tolerated and very effective for his mother. Start Tegretol 200 mgrs BID- hold if sedated   Consultations:  As needed   Discharge Concerns:  Homelessness   Estimated LOS:4-5 days   Other:     Physician Treatment Plan for Primary Diagnosis:  Bipolar Disorder Depressed, versus Alcohol Induced Mood Disorder  Long Term Goal(s): Improvement in symptoms so as ready for discharge  Short Term Goals: Ability to identify changes in lifestyle to reduce recurrence of condition will improve and Ability to identify and develop effective coping behaviors will improve  Physician Treatment Plan for Secondary Diagnosis: Alcohol Use Disorder  Long Term Goal(s): Improvement in symptoms so as ready for discharge  Short Term Goals: Ability to maintain clinical measurements within normal limits will improve  I certify that inpatient services furnished can reasonably be expected to improve the patient's condition.    Craige CottaFernando A Cobos, MD 2/7/20204:06 PM

## 2019-01-17 NOTE — ED Notes (Signed)
Pt resting comfortably. Sitter at bedside.

## 2019-01-18 ENCOUNTER — Encounter (HOSPITAL_COMMUNITY): Payer: Self-pay | Admitting: Behavioral Health

## 2019-01-18 LAB — HEMOGLOBIN A1C
Hgb A1c MFr Bld: 5.2 % (ref 4.8–5.6)
Mean Plasma Glucose: 102.54 mg/dL

## 2019-01-18 LAB — TSH: TSH: 2.261 u[IU]/mL (ref 0.350–4.500)

## 2019-01-18 MED ORDER — NICOTINE POLACRILEX 2 MG MT GUM
2.0000 mg | CHEWING_GUM | OROMUCOSAL | Status: DC | PRN
  Administered 2019-01-18 – 2019-01-23 (×13): 2 mg via ORAL
  Filled 2019-01-18 (×3): qty 1
  Filled 2019-01-18: qty 8

## 2019-01-18 NOTE — Progress Notes (Addendum)
Patient ID: Brady Doughtyhilip O Schuff, male   DOB: 11/16/1991, 28 y.o.   MRN: [id number]  Nursing Progress Note [id number]  Data: Patient presents with anxious affect and depressed mood. Patient compliant with scheduled medications. Patient complaining of withdrawal symptoms and does appear to have some sweating/tremors. Patient completed self-inventory sheet and rates depression, hopelessness, and anxiety 5,5,5 respectively. Patient rates their sleep and appetite as fair/fair respectively. Patient states goal for today is to "stay busy and work on my workbook". Patient is seen attending groups and interactive in the milieu. Patient currently denies SI/HI/AVH. 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  Action: Patient is educated about and provided medication per provider's orders. Patient safety maintained with q15 min safety checks and frequent rounding. High fall risk precautions in place. Emotional support given. 1:1 interaction and active listening provided. Patient encouraged to attend meals, groups, and work on treatment plan and goals. Labs, vital signs and patient behavior monitored throughout shift.   Response: Patient remains safe on the unit at this time and agrees to come to staff with any issues/concerns. Patient is interacting with peers appropriately on the unit. Will continue to support and monitor.

## 2019-01-18 NOTE — [facility] Group Notes (Signed)
[facility] LCSW Group Therapy Note  Date/Time:    01/18/2019 10:00-11:00AM  Type of Therapy and Topic:  Group Therapy:  Healthy vs Unhealthy Coping Skills  Participation Level:  Active   Description of Group:  The focus of this group was to determine what unhealthy coping techniques typically are used by group members and what healthy coping techniques would be helpful in coping with various problems. Patients were guided in becoming aware of the differences between healthy and unhealthy coping techniques.  Patients were asked to identify 1-2 healthy coping skills they would like to learn to use more effectively, and many mentioned meditation, breathing, and relaxation.  These were explained, samples demonstrated, and resources shared for how to learn more at discharge.    Therapeutic Goals 1. Patients learned that coping is what human beings do all day long to deal with various situations in their lives 2. Patients defined and discussed healthy vs unhealthy coping techniques 3. Patients identified their preferred coping techniques and identified whether these were healthy or unhealthy 4. Patients determined 1-2 healthy coping skills they would like to become more familiar with and use more often, and practiced a few meditations 5. Patients provided support and ideas to each other  Summary of Patient Progress: During group, patient expressed that he uses sarcasm and excessive masturbation as unhealthy coping techniques.  He was open to other means, but not specific.   Therapeutic Modalities Cognitive Behavioral Therapy Motivational Interviewing   Ambrose MantleMareida Grossman-Orr, LCSW 01/18/2019, 11:19 AM
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
## 2019-01-18 NOTE — Progress Notes (Signed)
Pt attended AA group this evening.  

## 2019-01-18 NOTE — Progress Notes (Signed)
[facility] MD Progress Note  01/18/2019 10:56 AM Brady Mckinney  MRN:  [id number]  Subjective: Patient reports withdrawal symptoms to include feeling nauseous, tired and states" I am feeling pretty rough today".  Denies suicidal ideations. Denies medication side effects.  Objective: Chart reviewed, patient seen  in brief; ths is a 28 year old male, presented to ED voluntarily on 2/6, reporting worsening depression, feeling hopeless, and having suicidal ideations, with thoughts of jumping in front of a truck. He reports history of intermittent depression since childhood, but states he has been more depressed over the last few weeks, and states he has been facing some significant stressors over recent weeks- homelessness, recent break up, losing  job recently, recent anniversary of prior GF's death , who died by overdose 2 year ago. He has also been drinking daily, usually about 4 beers ( 48 ounces ) per day.   Today patient is alert, attentive. Describes some improvement compared to how he felt prior to admission but endorses ongoing dysphoria/depression.  Describes symptoms of alcohol withdrawal to include feeling nauseous, restless, flushed.  Mild distal tremors are noted.  Blood pressure today 109/73 pulse 115.  States that in spite of nausea he was able to eat breakfast today, has not vomited today but does endorse vomiting yesterday. Denies suicidal ideations at present, contracts for safety on unit, presents future oriented expressing interest in going to a halfway house following discharge.  Thus far tolerating medications well -was started on Tegretol for history of bipolar disorder and report that his mother, also diagnosed with mood disorder, has reportedly done very well with this medication . Patient's behavior is in good control, no disruptive behaviors, some group participation. Hemoglobin A1c 5.2, TSH within normal limits.  Principal Problem: Alcohol dependence , alcohol withdrawal ,  bipolar disorder by history  Diagnosis: Active Problems:   MDD (major depressive disorder), severe (HCC)  Total Time spent with patient: 20 minutes  Past Psychiatric History:  Past Medical History:  Past Medical History:  Diagnosis Date  . Anxiety   . Bipolar 1 disorder (HCC)   . Schizoaffective disorder (HCC)    History reviewed. No pertinent surgical history. Family History:  Family History  Problem Relation Age of Onset  . Bipolar disorder Mother   . Schizophrenia Father    Family Psychiatric  History:  Social History:  Social History   Substance and Sexual Activity  Alcohol Use Yes   Comment: daily      Social History   Substance and Sexual Activity  Drug Use Never    Social History   Socioeconomic History  . Marital status: Single    Spouse name: Not on file  . Number of children: Not on file  . Years of education: Not on file  . Highest education level: Not on file  Occupational History  . Not on file  Social Needs  . Financial resource strain: Not on file  . Food insecurity:    Worry: Not on file    Inability: Not on file  . Transportation needs:    Medical: Not on file    Non-medical: Not on file  Tobacco Use  . Smoking status: Current Every Day Smoker    Packs/day: 0.45    Types: Cigarettes  . Smokeless tobacco: Never Used  Substance and Sexual Activity  . Alcohol use: Yes    Comment: daily   . Drug use: Never  . Sexual activity: Not on file  Lifestyle  . Physical activity:  Days per week: Not on file    Minutes per session: Not on file  . Stress: Not on file  Relationships  . Social connections:    Talks on phone: Not on file    Gets together: Not on file    Attends religious service: Not on file    Active member of club or organization: Not on file    Attends meetings of clubs or organizations: Not on file    Relationship status: Not on file  Other Topics Concern  . Not on file  Social History Narrative  . Not on file    Additional Social History:   Sleep: Fair  Appetite:  Fair; tolerated breakfast well.   Current Medications: Current Facility-Administered Medications  Medication Dose Route Frequency Provider Last Rate Last Dose  . alum & mag hydroxide-simeth (MAALOX/MYLANTA) 200-200-20 MG/5ML suspension 30 mL  30 mL Oral Q4H PRN Kerry HoughSimon, Spencer E, PA-C      . carbamazepine (TEGRETOL XR) 12 hr tablet 200 mg  200 mg Oral BID Cobos, Fernando A, MD   200 mg at 01/18/19 0817  . hydrOXYzine (ATARAX/VISTARIL) tablet 25 mg  25 mg Oral Q6H PRN Kerry HoughSimon, Spencer E, PA-C   25 mg at 01/17/19 2113  . ibuprofen (ADVIL,MOTRIN) tablet 600 mg  600 mg Oral Q6H PRN Kerry HoughSimon, Spencer E, PA-C   600 mg at 01/18/19 0818  . loperamide (IMODIUM) capsule 2-4 mg  2-4 mg Oral PRN Kerry HoughSimon, Spencer E, PA-C      . LORazepam (ATIVAN) tablet 1 mg  1 mg Oral Q6H PRN Kerry HoughSimon, Spencer E, PA-C      . LORazepam (ATIVAN) tablet 1 mg  1 mg Oral TID Donell SievertSimon, Spencer E, PA-C   1 mg at 01/18/19 [id number]   Followed by  . [START ON 01/19/2019] LORazepam (ATIVAN) tablet 1 mg  1 mg Oral BID Donell SievertSimon, Spencer E, PA-C       Followed by  . [START ON 01/20/2019] LORazepam (ATIVAN) tablet 1 mg  1 mg Oral Daily Simon, Spencer E, PA-C      . magnesium hydroxide (MILK OF MAGNESIA) suspension 30 mL  30 mL Oral Daily PRN Kerry HoughSimon, Spencer E, PA-C      . multivitamin with minerals tablet 1 tablet  1 tablet Oral Daily Kerry HoughSimon, Spencer E, PA-C   1 tablet at 01/18/19 [id number]  . nicotine (NICODERM CQ - dosed in mg/24 hours) patch 21 mg  21 mg Transdermal Q24H Cobos, Rockey SituFernando A, MD      . ondansetron (ZOFRAN-ODT) disintegrating tablet 4 mg  4 mg Oral Q6H PRN Kerry HoughSimon, Spencer E, PA-C      . thiamine (B-1) injection 100 mg  100 mg Intramuscular Once Donell SievertSimon, Spencer E, PA-C      . thiamine (VITAMIN B-1) tablet 100 mg  100 mg Oral Daily Donell SievertSimon, Spencer E, PA-C   100 mg at 01/18/19 0817  . traZODone (DESYREL) tablet 50 mg  50 mg Oral QHS PRN Cobos, Rockey SituFernando A, MD   50 mg at 01/17/19 2113    Lab  Results:  Results for orders placed or performed during the hospital encounter of 01/17/19 (from the past 48 hour(s))  Hemoglobin A1c     Status: None   Collection Time: 01/18/19  6:28 AM  Result Value Ref Range   Hgb A1c MFr Bld 5.2 4.8 - 5.6 %    Comment: (NOTE) Pre diabetes:          5.7%-6.4% Diabetes:              >  6.4% Glycemic control for   <7.0% adults with diabetes    Mean Plasma Glucose 102.54 mg/dL    Comment: Performed at [facility], [address] [place], [place] [address]  TSH     Status: None   Collection Time: 01/18/19  6:28 AM  Result Value Ref Range   TSH 2.261 0.350 - 4.500 uIU/mL    Comment: Performed by a 3rd Generation assay with a functional sensitivity of <=0.01 uIU/mL. Performed at [facility], [address] [place], [place] [address]     Blood Alcohol level:  Lab Results  Component Value Date   ETH [facility](HH) 01/16/2019    Metabolic Disorder Labs: Lab Results  Component Value Date   HGBA1C 5.2 01/18/2019   MPG 102.54 01/18/2019   No results found for: PROLACTIN No results found for: CHOL, TRIG, HDL, CHOLHDL, VLDL, LDLCALC  Physical Findings: AIMS: Facial and Oral Movements Muscles of Facial Expression: None, normal Lips and Perioral Area: None, normal Jaw: None, normal Tongue: None, normal,Extremity Movements Upper (arms, wrists, hands, fingers): None, normal Lower (legs, knees, ankles, toes): None, normal, Trunk Movements Neck, shoulders, hips: None, normal, Overall Severity Severity of abnormal movements (highest score from questions above): None, normal Incapacitation due to abnormal movements: None, normal Patient's awareness of abnormal movements (rate only patient's report): No Awareness, Dental Status Current problems with teeth and/or dentures?: No Does patient usually wear dentures?: No  CIWA:  CIWA-Ar Total: 9 COWS:     Musculoskeletal: Strength & Muscle Tone: within normal limits Gait &  Station: normal Patient leans: N/A  Psychiatric Specialty Exam: Physical Exam  ROS no chest pain, no shortness of breath, positive nausea, no vomiting today.  No fever, no chills  Blood pressure 109/73, pulse (!) 115, temperature 97.6 F (36.4 C), resp. rate 16, height 5\' 8"  (1.727 m), weight 65.8 kg, SpO2 98 %.Body mass index is 22.05 kg/m.  General Appearance: Fairly Groomed  Eye Contact:  Fair-eye contact improves partially during session  Speech:  Normal Rate  Volume:  Normal  Mood:  Depressed/dysphoric  Affect:  Congruent  Thought Process:  Linear and Descriptions of Associations: Intact  Orientation:  Other:  Fully alert and attentive  Thought Content:  Logical no hallucinations, no delusions, not internally preoccupied   Suicidal Thoughts:  No denies current suicidal or self injurious ideations, contracts for safety on unit  Homicidal Thoughts:  No denies homicidal or violent ideations   Memory:  Recent and remote grossly intact  Judgement:  Fair/improving  Insight:  Fair  Psychomotor Activity:  Mild distal tremors, positive facial flushing, no psychomotor agitation  Concentration:  Concentration: Fair and Attention Span: Fair  Recall:  [employer]  Fund of Knowledge:  Fair  Language:  Good  Akathisia:  Negative  Handed:  Right  AIMS (if indicated):     Assets:  Communication Skills Desire for Improvement Resilience Social Support  ADL's:  Intact  Cognition:  WNL  Sleep:  Number of Hours: 6.75   Assessment- 28 year old male, presented to ED voluntarily on 2/6, reporting worsening depression, feeling hopeless, and having suicidal ideations, with thoughts of jumping in front of a truck. He reports history of intermittent depression since childhood, but states he has been more depressed over the last few weeks, and states he has been facing some significant stressors over recent weeks- homelessness, recent break up, losing  job recently, recent anniversary of prior GF's death ,  who died by overdose 2 year  ago. He has also been drinking daily, usually about 4 beers ( 48 ounces ) per day. Today patient reports some improvement compared to how he felt prior to admission but describes persistent dysphoria, depression, and endorses symptoms of alcohol withdrawal, mainly tremulousness, nausea.  Vitals are stable.  He is currently tolerating medications well, does not endorse side effects at this time.  Denies suicidal ideations.   Treatment Plan Summary: Reviewed as below 01/18/2019. Daily contact with patient to assess and evaluate symptoms and progress in treatment  Encourage group and milieu participation to work on coping skills and symptom reduction Encourage efforts to work on sobriety and relapse prevention Treatment team working on disposition planning options- as noted patient interested in going to an [facility] or [facility] at discharge. Continue Tegretol 200 mgrs BID for mood disorder Continue  Ativan detox protocol to address  alcohol WDL symptoms.  Continue Vistaril 25 mgrs 25 mgrs Q 6 hours PRN for anxiety  Continue Trazodone 50 mgrs QHS PRN for insomnia  Nehemiah Massed, MD

## 2019-01-19 MED ORDER — GABAPENTIN 100 MG PO CAPS
100.0000 mg | ORAL_CAPSULE | Freq: Three times a day (TID) | ORAL | Status: DC
  Administered 2019-01-19 – 2019-01-23 (×13): 100 mg via ORAL
  Filled 2019-01-19: qty 21
  Filled 2019-01-19: qty 1
  Filled 2019-01-19: qty 21
  Filled 2019-01-19: qty 1
  Filled 2019-01-19 (×2): qty 21
  Filled 2019-01-19: qty 1
  Filled 2019-01-19: qty 21
  Filled 2019-01-19 (×9): qty 1
  Filled 2019-01-19: qty 21
  Filled 2019-01-19 (×5): qty 1

## 2019-01-19 NOTE — Progress Notes (Signed)
[facility] MD Progress Note  01/19/2019 8:58 AM Brady Mckinney  MRN:  [id number]  Subjective: Patient describes partially improved mood, although reports lingering depression.  States that anxiety symptoms have worsened off alcohol and states "alcohol makes my depression worse but does help my anxiety.  When I start drinking my mood gets better but my anxiety gets worse sometimes". Currently describes anxiety related to psychosocial stressors such as homelessness and potential disposition planning.  He does describe a history of excessive anxiety/worry.  Today denies suicidal ideations. Denies medication side effects  Objective: Chart reviewed, patient seen  in brief; ths is a 28 year old male, presented to ED voluntarily on 2/6, reporting worsening depression, feeling hopeless, and having suicidal ideations, with thoughts of jumping in front of a truck. He reports history of intermittent depression since childhood, but states he has been more depressed over the last few weeks, and states he has been facing some significant stressors over recent weeks- homelessness, recent break up, losing  job recently, recent anniversary of prior GF's death , who died by overdose 2 year ago. He has also been drinking daily, usually about 4 beers ( 48 ounces ) per day.   Today patient presents with partially improved mood.  Affect is more reactive.  Describes, however, increasing subjective sense of anxiety and states he feels he has an underlying anxiety disorder, with a tendency towards generalized anxiety and excessive worry.   Currently endorses some lingering symptoms of alcohol withdrawal such as mild distal tremors and feeling vaguely "shaky".  Does not appear to be in any acute distress, no diaphoresis, vitals are currently stable. He denies medication side effects at this time. Currently tolerating medications well.  We discussed options to help manage anxiety.   No disruptive or agitated behaviors on  unit.  Principal Problem: Alcohol dependence , alcohol withdrawal , bipolar disorder by history  Diagnosis: Active Problems:   MDD (major depressive disorder), severe (HCC)  Total Time spent with patient: 20 minutes  Past Psychiatric History:  Past Medical History:  Past Medical History:  Diagnosis Date  . Anxiety   . Bipolar 1 disorder (HCC)   . Schizoaffective disorder (HCC)    History reviewed. No pertinent surgical history. Family History:  Family History  Problem Relation Age of Onset  . Bipolar disorder Mother   . Schizophrenia Father    Family Psychiatric  History:  Social History:  Social History   Substance and Sexual Activity  Alcohol Use Yes   Comment: daily      Social History   Substance and Sexual Activity  Drug Use Never    Social History   Socioeconomic History  . Marital status: Single    Spouse name: Not on file  . Number of children: Not on file  . Years of education: Not on file  . Highest education level: Not on file  Occupational History  . Not on file  Social Needs  . Financial resource strain: Not on file  . Food insecurity:    Worry: Not on file    Inability: Not on file  . Transportation needs:    Medical: Not on file    Non-medical: Not on file  Tobacco Use  . Smoking status: Current Every Day Smoker    Packs/day: 0.45    Types: Cigarettes  . Smokeless tobacco: Never Used  Substance and Sexual Activity  . Alcohol use: Yes    Comment: daily   . Drug use: Never  . Sexual  activity: Not on file  Lifestyle  . Physical activity:    Days per week: Not on file    Minutes per session: Not on file  . Stress: Not on file  Relationships  . Social connections:    Talks on phone: Not on file    Gets together: Not on file    Attends religious service: Not on file    Active member of club or organization: Not on file    Attends meetings of clubs or organizations: Not on file    Relationship status: Not on file  Other Topics  Concern  . Not on file  Social History Narrative  . Not on file   Additional Social History:   Sleep: Improving  Appetite:  Fair/improving  Current Medications: Current Facility-Administered Medications  Medication Dose Route Frequency Provider Last Rate Last Dose  . alum & mag hydroxide-simeth (MAALOX/MYLANTA) 200-200-20 MG/5ML suspension 30 mL  30 mL Oral Q4H PRN Kerry Hough, PA-C      . carbamazepine (TEGRETOL XR) 12 hr tablet 200 mg  200 mg Oral BID ,  A, MD   200 mg at 01/19/19 0814  . hydrOXYzine (ATARAX/VISTARIL) tablet 25 mg  25 mg Oral Q6H PRN Kerry Hough, PA-C   25 mg at 01/18/19 2205  . ibuprofen (ADVIL,MOTRIN) tablet 600 mg  600 mg Oral Q6H PRN Kerry Hough, PA-C   600 mg at 01/18/19 2145  . loperamide (IMODIUM) capsule 2-4 mg  2-4 mg Oral PRN Kerry Hough, PA-C      . LORazepam (ATIVAN) tablet 1 mg  1 mg Oral Q6H PRN Kerry Hough, PA-C      . LORazepam (ATIVAN) tablet 1 mg  1 mg Oral BID Kerry Hough, PA-C   1 mg at 01/19/19 0815   Followed by  . [START ON 01/20/2019] LORazepam (ATIVAN) tablet 1 mg  1 mg Oral Daily Simon, Spencer E, PA-C      . magnesium hydroxide (MILK OF MAGNESIA) suspension 30 mL  30 mL Oral Daily PRN Kerry Hough, PA-C      . multivitamin with minerals tablet 1 tablet  1 tablet Oral Daily Kerry Hough, PA-C   1 tablet at 01/19/19 1610  . nicotine polacrilex (NICORETTE) gum 2 mg  2 mg Oral PRN , Rockey Situ, MD   2 mg at 01/18/19 2143  . ondansetron (ZOFRAN-ODT) disintegrating tablet 4 mg  4 mg Oral Q6H PRN Kerry Hough, PA-C   4 mg at 01/18/19 1709  . thiamine (B-1) injection 100 mg  100 mg Intramuscular Once Brady Sievert E, PA-C      . thiamine (VITAMIN B-1) tablet 100 mg  100 mg Oral Daily Brady Sievert E, PA-C   100 mg at 01/19/19 9604  . traZODone (DESYREL) tablet 50 mg  50 mg Oral QHS PRN , Rockey Situ, MD   50 mg at 01/18/19 2205    Lab Results:  Results for orders placed or performed  during the hospital encounter of 01/17/19 (from the past 48 hour(s))  Hemoglobin A1c     Status: None   Collection Time: 01/18/19  6:28 AM  Result Value Ref Range   Hgb A1c MFr Bld 5.2 4.8 - 5.6 %    Comment: (NOTE) Pre diabetes:          5.7%-6.4% Diabetes:              >6.4% Glycemic control for   <7.0% adults with diabetes  Mean Plasma Glucose 102.54 mg/dL    Comment: Performed at [facility], [address], [place], [place] [address]  TSH     Status: None   Collection Time: 01/18/19  6:28 AM  Result Value Ref Range   TSH 2.261 0.350 - 4.500 uIU/mL    Comment: Performed by a 3rd Generation assay with a functional sensitivity of <=0.01 uIU/mL. Performed at [facility], [address] [place], [place] [address]     Blood Alcohol level:  Lab Results  Component Value Date   ETH [facility]) 01/16/2019    Metabolic Disorder Labs: Lab Results  Component Value Date   HGBA1C 5.2 01/18/2019   MPG 102.54 01/18/2019   No results found for: PROLACTIN No results found for: CHOL, TRIG, HDL, CHOLHDL, VLDL, LDLCALC  Physical Findings: AIMS: Facial and Oral Movements Muscles of Facial Expression: None, normal Lips and Perioral Area: None, normal Jaw: None, normal Tongue: None, normal,Extremity Movements Upper (arms, wrists, hands, fingers): None, normal Lower (legs, knees, ankles, toes): None, normal, Trunk Movements Neck, shoulders, hips: None, normal, Overall Severity Severity of abnormal movements (highest score from questions above): None, normal Incapacitation due to abnormal movements: None, normal Patient's awareness of abnormal movements (rate only patient's report): No Awareness, Dental Status Current problems with teeth and/or dentures?: No Does patient usually wear dentures?: No  CIWA:  CIWA-Ar Total: 2 COWS:     Musculoskeletal: Strength & Muscle Tone: within normal limits mild distal tremors noted Gait & Station: normal Patient leans:  N/A  Psychiatric Specialty Exam: Physical Exam  ROS no chest pain, no shortness of breath, positive nausea, no vomiting today.  No fever, no chills  Blood pressure 116/77, pulse 96, temperature 97.6 F (36.4 C), temperature source Oral, resp. rate 16, height 5\' 8"  (1.727 m), weight 65.8 kg, SpO2 98 %.Body mass index is 22.05 kg/m.  General Appearance: Improving grooming  Eye Contact:  Improving eye contact  Speech:  Normal Rate  Volume:  Normal  Mood:  Reports partially improved mood, describes anxiety  Affect:  Appropriate and Anxious  Thought Process:  Linear and Descriptions of Associations: Intact  Orientation:  Other:  Fully alert and attentive  Thought Content:  Logical no hallucinations, no delusions, not internally preoccupied   Suicidal Thoughts:  No denies current suicidal or self injurious ideations, contracts for safety on unit  Homicidal Thoughts:  No denies homicidal or violent ideations   Memory:  Recent and remote grossly intact  Judgement:  Fair/improving  Insight: Improving  Psychomotor Activity:  Mild distal tremors, today no diaphoresis noted, no psychomotor agitation  Concentration:  Concentration: Good and Attention Span: Good  Recall:  Good  Fund of Knowledge:  Good  Language:  Good  Akathisia:  Negative  Handed:  Right  AIMS (if indicated):     Assets:  Communication Skills Desire for Improvement Resilience Social Support  ADL's:  Intact  Cognition:  WNL  Sleep:  Number of Hours: 6   Assessment- 28 year old male, presented to ED voluntarily on 2/6, reporting worsening depression, feeling hopeless, and having suicidal ideations, with thoughts of jumping in front of a truck. He reports history of intermittent depression since childhood, but states he has been more depressed over the last few weeks, and states he has been facing some significant stressors over recent weeks- homelessness, recent break up, losing  job recently, recent anniversary of prior  GF's death , who died by overdose 2 year ago. He has  also been drinking daily, usually about 4 beers ( 48 ounces ) per day.  Currently patient describes partially improved mood and does present with a more reactive affect.  Denies suicidal ideations.  Endorses a history of anxiety, describes tendency towards generalized anxiety and excessive worrying.  Presents with some residual withdrawal symptoms such as mild to distal tremors.  Vitals are stable.  At this time future oriented and focusing on disposition planning options.  Treatment Plan Summary: Reviewed as below 01/19/2019. Daily contact with patient to assess and evaluate symptoms and progress in treatment  Encourage group and milieu participation to work on coping skills and symptom reduction Encourage efforts to work on sobriety and relapse prevention Treatment team working on disposition planning options- as noted patient interested in going to [facility] or [facility]. Continue Tegretol 200 mgrs BID for mood disorder Start Neurontin 100 mg TID for anxiety- side effects and off label use reviewed  Continue  Ativan detox protocol to address  alcohol WDL symptoms.  Continue Vistaril 25 mgrs 25 mgrs Q 6 hours PRN for anxiety  Continue Trazodone 50 mgrs QHS PRN for insomnia  Nehemiah MassedFernando , MD  Patient ID: Brady Mckinney, male   DOB: 1991/10/04, 28 y.o.   MRN: [id number]

## 2019-01-19 NOTE — Progress Notes (Signed)
D: Patient observed up and around the unit. Interacting with peers and staff. Somewhat restless. Patient states, "I'm doing ok. It's been a better day today." Patient's affect animated, mood anxious but pleasant. Complained of a headache of a 4/10, no other physical complaints, no withdrawal symptoms observed or reported. CIWA is a "2" for anxiety.  A: Medicated per orders, prn advil, nicorette, trazadone and vistaril given per his request. Medication education provided. Level III obs in place for safety. Emotional support offered. Patient encouraged to complete Suicide Safety Plan before discharge. Encouraged to attend and participate in unit programming.    R: Patient verbalizes understanding of POC. On reassess, patient headache is resolved, patient resting in bed. Patient denies SI/HI/AVH and remains safe on level III obs. Will continue to monitor throughout the night.

## 2019-01-19 NOTE — [facility] Suicide Risk Assessment (Signed)
[facility] INPATIENT:  Family/Significant Other Suicide Prevention Education  Suicide Prevention Education:  Education Completed; sister Calcifer Schweitzer [phone] has been identified by the patient as the family member/significant other with whom the patient will be residing, and identified as the person(s) who will aid the patient in the event of a mental health crisis (suicidal ideations/suicide attempt).  With written consent from the patient, the family member/significant other has been provided the following suicide prevention education, prior to the and/or following the discharge of the patient.  The suicide prevention education provided includes the following:  Suicide risk factors  Suicide prevention and interventions  National Suicide Hotline telephone number  [facility] assessment telephone number  [facility] Emergency Assistance 911  [facility] Behavioral Health and/or Residential Mobile Crisis Unit telephone number  Request made of family/significant other to:  Remove weapons (e.g., guns, rifles, knives), all items previously/currently identified as safety concern.    Remove drugs/medications (over-the-counter, prescriptions, illicit drugs), all items previously/currently identified as a safety concern.  The family member/significant other verbalizes understanding of the suicide prevention education information provided.  The family member/significant other agrees to remove the items of safety concern listed above.  Sister shares patient has been homeless since January and that he is not allowed to live with her as she feels allowing him to live with her while he drinks is enabling the patient's alcoholism.  Sister has safety concerns for patient returning to the streets, she hopes he can get into residential treatment. She shared general concerns regarding patient's carelessness while drunk (falling asleep on the couch with a lit cigarette or leaving the stove on).    Darreld Mclean 01/19/2019, 2:18 PM

## 2019-01-19 NOTE — Progress Notes (Signed)
Patient did attend the evening speaker AA meeting.  

## 2019-01-19 NOTE — Progress Notes (Signed)
[facility] Group Notes:  (Nursing/MHT/Case Management/Adjunct)  Date:  01/18/2019  Time:  2030  Type of Therapy:  wrap up group  Participation Level:  Active  Participation Quality:  Appropriate, Attentive, Sharing and Supportive  Affect:  Anxious and Appropriate  Cognitive:  Appropriate  Insight:  Improving  Engagement in Group:  Engaged  Modes of Intervention:  Clarification, Education and Support  Summary of Progress/Problems: Pt shared is positive for the day was that he was feeling better physically but not yet mentally. Pt reports feeling increased anxiety since stopping drinking and one of the reasons that he drank was to help with anxiety. Pt reports having "worst case scenario thinking" returning. If patient could change any one thing about his life it would be having somewhere to live. Pt reports losing his housing, girlfriend, and job. Pt has plans to attend [facility].   Brady Mckinney, Brady Mckinney 01/19/2019, 2:29 AM

## 2019-01-19 NOTE — [facility] Group Notes (Signed)
[facility] LCSW Group Therapy Note  01/19/2019   10:00-11:00AM  Type of Therapy and Topic:  Group Therapy:  Unhealthy versus Healthy Supports, Which Am I?  Participation Level:  Active   Description of Group:  Patients in this group were introduced to the concept that additional supports including self-support are an essential part of recovery.  Initially a discussion was held about the differences between healthy versus unhealthy supports.  Patients were asked to share what unhealthy supports in their lives need to be addressed, as well as what additional healthy supports could be added for greater help in reaching their goals.   A song entitled "My Own Hero" was played and a group discussion ensued in which patients stated they could relate to the song and it inspired them to realize they have be willing to help themselves in order to succeed, because other people cannot achieve sobriety or stability for them.  We discussed adding a variety of healthy supports to address the various needs in patient lives, including becoming more self-supportive.  Therapeutic Goals: 1)  Highlight the differences between healthy and unhealthy supports 2)  Suggest the importance of being a part of one's own support system 2)  Discuss reasons people in one's life may eventually be unable to be continually supportive  3)  Identify the patient's current support system and   4)  elicit commitments to add healthy supports and to become more conscious of being self-supportive   Summary of Patient Progress:  The patient expressed that the unhealthy support which needs to be addressed includes his "so-called friends and miscreants."  Healthy supports which add increased stability and happiness include his sister who is stable and understands what it was like to grow up with a mother who had bipolar disorder and his mother who loves him unconditionally.  He expressed a great deal of skepticism about 12-step programs, saying he  worked through the 12 steps and even sponsored someone, then relapsed so it "didn't work."  Therapeutic Modalities:   Motivational Interviewing Activity  Lynnell Chad

## 2019-01-19 NOTE — Plan of Care (Signed)
  Problem: Coping: Goal: Ability to verbalize frustrations and anger appropriately will improve Outcome: Not Progressing   Problem: Safety: Goal: Periods of time without injury will increase Outcome: Progressing   D: Pt alert and oriented on the unit. Pt engaging with RN staff and other pts. Pt denies SI/HI, A/VH. Pt attended afternoon music group then came out crying and sat in the hallway. RN processed with pt and pt stated that he wanted to talk to his dad about being here so he can mentally "get through it." Pt is cooperative. A: Education, support and encouragement provided, q15 minute safety checks remain in effect. Medications administered per MD orders. R: No reactions/side effects to medicine noted. Pt denies any concerns at this time, and verbally contracts for safety. Pt ambulating on the unit with no issues. Pt remains safe on and off the unit.

## 2019-01-20 DIAGNOSIS — F1010 Alcohol abuse, uncomplicated: Secondary | ICD-10-CM

## 2019-01-20 LAB — CARBAMAZEPINE LEVEL, TOTAL: CARBAMAZEPINE LVL: 4.7 ug/mL (ref 4.0–12.0)

## 2019-01-20 MED ORDER — CARBAMAZEPINE ER 200 MG PO TB12
200.0000 mg | ORAL_TABLET | Freq: Every day | ORAL | Status: DC
  Filled 2019-01-20: qty 1

## 2019-01-20 MED ORDER — CARBAMAZEPINE ER 200 MG PO TB12
200.0000 mg | ORAL_TABLET | Freq: Two times a day (BID) | ORAL | Status: DC
  Administered 2019-01-20 – 2019-01-23 (×6): 200 mg via ORAL
  Filled 2019-01-20 (×3): qty 14
  Filled 2019-01-20 (×2): qty 1
  Filled 2019-01-20: qty 14
  Filled 2019-01-20 (×6): qty 1

## 2019-01-20 MED ORDER — SALINE SPRAY 0.65 % NA SOLN
1.0000 | NASAL | Status: DC | PRN
  Filled 2019-01-20: qty 44

## 2019-01-20 MED ORDER — CARBAMAZEPINE ER 400 MG PO TB12
400.0000 mg | ORAL_TABLET | Freq: Every day | ORAL | Status: DC
  Filled 2019-01-20: qty 1

## 2019-01-20 MED ORDER — HYDROXYZINE HCL 25 MG PO TABS
25.0000 mg | ORAL_TABLET | Freq: Four times a day (QID) | ORAL | Status: DC | PRN
  Administered 2019-01-20 – 2019-01-22 (×3): 25 mg via ORAL
  Filled 2019-01-20: qty 10
  Filled 2019-01-20: qty 1

## 2019-01-20 NOTE — Progress Notes (Signed)
Patient rated his day overall as a 9 out of a possible 10. He explained that he feels good but is concerned about one issue regarding his discharge. He states that [name] will not have a bed for him for more than a week and that he can stay with his sister for a few days. His plan is to check with [facility] on a daily basis to see if they have a bed available for him.

## 2019-01-20 NOTE — Progress Notes (Addendum)
[facility]  01/20/2019 3:09 PM Brady Mckinney  MRN:  [id number] Subjective:  "I'm much better."  Brady Mckinney found playing cards in the dayroom. He reports improving mood and ability to think, smiles at times appropriately. Denies withdrawal symptoms today. He reports increased levels of anxiety starting yesterday but gabapentin has helped. He had a panic attack yesterday but PRN Vistaril helped. He is mostly anxious regarding discharge planning due to being homeless with nowhere to go. [facility] but will likely be put on a wait list for a bed. Denies SI. Denies medication side effects. Tegretol level 4.7 this AM.  From admission H&P: 28 year old male, presented to ED voluntarily on 2/6, reporting worsening depression, feeling hopeless, and having suicidal ideations, with thoughts of jumping in front of a truck. He reports history of intermittent depression since childhood, but states he has been more depressed over the last few weeks, and states he has been facing some significant stressors over recent weeks- homelessness, recent break up, losing  job recently, recent anniversary of prior GF's death , who died by overdose 2 year ago. He has also been drinking daily, usually about 4 beers ( 48 ounces ) per day. Admission BAL 453, admission UDS (+) for Cannabis. Endorses neuro-vegetative symptoms as below.  Principal Problem: Alcohol abuse Diagnosis: Principal Problem:   Alcohol abuse Active Problems:   MDD (major depressive disorder), severe (HCC)  Total Time spent with patient: 15 minutes  Past Psychiatric History: See admission H&P  Past Medical History:  Past Medical History:  Diagnosis Date  . Anxiety   . Bipolar 1 disorder (HCC)   . Schizoaffective disorder (HCC)    History reviewed. No pertinent surgical history. Family History:  Family History  Problem Relation Age of Onset  . Bipolar disorder Mother   . Schizophrenia Father    Family  Psychiatric  History: See admission H&P Social History:  Social History   Substance and Sexual Activity  Alcohol Use Yes   Comment: daily      Social History   Substance and Sexual Activity  Drug Use Never    Social History   Socioeconomic History  . Marital status: Single    Spouse name: Not on file  . Number of children: Not on file  . Years of education: Not on file  . Highest education level: Not on file  Occupational History  . Not on file  Social Needs  . Financial resource strain: Not on file  . Food insecurity:    Worry: Not on file    Inability: Not on file  . Transportation needs:    Medical: Not on file    Non-medical: Not on file  Tobacco Use  . Smoking status: Current Every Day Smoker    Packs/day: 0.45    Types: Cigarettes  . Smokeless tobacco: Never Used  Substance and Sexual Activity  . Alcohol use: Yes    Comment: daily   . Drug use: Never  . Sexual activity: Not on file  Lifestyle  . Physical activity:    Days per week: Not on file    Minutes per session: Not on file  . Stress: Not on file  Relationships  . Social connections:    Talks on phone: Not on file    Gets together: Not on file    Attends religious service: Not on file    Active member of club or organization: Not on file  Attends meetings of clubs or organizations: Not on file    Relationship status: Not on file  Other Topics Concern  . Not on file  Social History Narrative  . Not on file   Additional Social History:                         Sleep: Good  Appetite:  Good  Current Medications: Current Facility-Administered Medications  Medication Dose Route Frequency Provider Last Rate Last Dose  . alum & mag hydroxide-simeth (MAALOX/MYLANTA) 200-200-20 MG/5ML suspension 30 mL  30 mL Oral Q4H PRN Kerry Hough, PA-C      . [START ON 01/21/2019] carbamazepine (TEGRETOL XR) 12 hr tablet 200 mg  200 mg Oral Daily Aldean Baker, NP      . carbamazepine  (TEGRETOL XR) 12 hr tablet 400 mg  400 mg Oral QHS Aldean Baker, NP      . gabapentin (NEURONTIN) capsule 100 mg  100 mg Oral TID Cobos, Rockey Situ, MD   100 mg at 01/20/19 1201  . hydrOXYzine (ATARAX/VISTARIL) tablet 25 mg  25 mg Oral Q6H PRN Aldean Baker, NP      . ibuprofen (ADVIL,MOTRIN) tablet 600 mg  600 mg Oral Q6H PRN Kerry Hough, PA-C   600 mg at 01/18/19 2145  . magnesium hydroxide (MILK OF MAGNESIA) suspension 30 mL  30 mL Oral Daily PRN Kerry Hough, PA-C      . multivitamin with minerals tablet 1 tablet  1 tablet Oral Daily Kerry Hough, PA-C   1 tablet at 01/20/19 0807  . nicotine polacrilex (NICORETTE) gum 2 mg  2 mg Oral PRN Cobos, Rockey Situ, MD   2 mg at 01/20/19 1316  . thiamine (B-1) injection 100 mg  100 mg Intramuscular Once Donell Sievert E, PA-C      . thiamine (VITAMIN B-1) tablet 100 mg  100 mg Oral Daily Donell Sievert E, PA-C   100 mg at 01/20/19 9604  . traZODone (DESYREL) tablet 50 mg  50 mg Oral QHS PRN Cobos, Rockey Situ, MD   50 mg at 01/19/19 2202    Lab Results:  Results for orders placed or performed during the hospital encounter of 01/17/19 (from the past 48 hour(s))  Carbamazepine level, total     Status: None   Collection Time: 01/20/19  6:42 AM  Result Value Ref Range   Carbamazepine Lvl 4.7 4.0 - 12.0 ug/mL    Comment: Performed at [facility], [address] [place], [place] [address]    Blood Alcohol level:  Lab Results  Component Value Date   ETH 453 [facility]) 01/16/2019    Metabolic Disorder Labs: Lab Results  Component Value Date   HGBA1C 5.2 01/18/2019   MPG 102.54 01/18/2019   No results found for: PROLACTIN No results found for: CHOL, TRIG, HDL, CHOLHDL, VLDL, LDLCALC  Physical Findings: AIMS: Facial and Oral Movements Muscles of Facial Expression: None, normal Lips and Perioral Area: None, normal Jaw: None, normal Tongue: None, normal,Extremity Movements Upper (arms, wrists, hands, fingers): None,  normal Lower (legs, knees, ankles, toes): None, normal, Trunk Movements Neck, shoulders, hips: None, normal, Overall Severity Severity of abnormal movements (highest score from questions above): None, normal Incapacitation due to abnormal movements: None, normal Patient's awareness of abnormal movements (rate only patient's report): No Awareness, Dental Status Current problems with teeth and/or dentures?: No Does patient usually wear dentures?: No  CIWA:  CIWA-Ar Total:  0 COWS:     Musculoskeletal: Strength & Muscle Tone: within normal limits Gait & Station: normal Patient leans: N/A  Psychiatric Specialty Exam: Physical Exam  Nursing note and vitals reviewed. Constitutional: He is oriented to person, place, and time. He appears well-developed and well-nourished.  Cardiovascular: Normal rate.  Respiratory: Effort normal.  Neurological: He is alert and oriented to person, place, and time.    Review of Systems  Constitutional: Negative.  Negative for malaise/fatigue.  HENT: Positive for nosebleeds (1 mild nosebleed yesterday- c/o dry nares). Negative for congestion.   Respiratory: Negative.   Cardiovascular: Negative.   Gastrointestinal: Negative for nausea and vomiting.  Musculoskeletal: Negative for myalgias.  Neurological: Negative for headaches.  Psychiatric/Behavioral: Positive for depression (improving) and substance abuse (ETOH, THC). Negative for hallucinations, memory loss and suicidal ideas. The patient is not nervous/anxious and does not have insomnia.     Blood pressure 109/65, pulse 85, temperature (!) 97.5 F (36.4 C), resp. rate 16, height 5\' 8"  (1.727 m), weight 65.8 kg, SpO2 100 %.Body mass index is 22.05 kg/m.  General Appearance: Casual  Eye Contact:  Fair  Speech:  Clear and Coherent and Normal Rate  Volume:  Normal  Mood:  Euthymic  Affect:  Constricted and Full Range  Thought Process:  Coherent  Orientation:  Full (Time, Place, and Person)  Thought  Content:  WDL  Suicidal Thoughts:  No  Homicidal Thoughts:  No  Memory:  Immediate;   Good Recent;   Good  Judgement:  Fair  Insight:  Fair  Psychomotor Activity:  Normal  Concentration:  Concentration: Good  Recall:  Good  Fund of Knowledge:  Fair  Language:  Good  Akathisia:  No  Handed:  Right  AIMS (if indicated):     Assets:  Communication Skills Desire for Improvement Leisure Time Physical Health Social Support  ADL's:  Intact  Cognition:  WNL  Sleep:  Number of Hours: 5.75     Treatment Plan Summary: Daily contact with patient to assess and evaluate symptoms and progress in treatment and Medication management   Continue inpatient hospitalization.  Increase carbamazepine to 200 mg PO QAM, 400 mg PO QHS for mood Continue gabapentin 100 mg PO TID for ETOH withdrawal, anxiety Start saline nasal spray PRN dry nares Continue Vistaril 25 mg Q6HR PRN anxiety Continue trazodone 50 mg PO QHS PRN insomnia  Patient will participate in the therapeutic group milieu.  Discharge disposition in progress.   Aldean BakerJanet E Sykes, NP 01/20/2019, 3:09 PM   Agree with NP progress note

## 2019-01-20 NOTE — Tx Team (Signed)
Interdisciplinary Treatment and Diagnostic Plan Update  01/20/2019 Time of Session: 0830AM Brady Mckinney MRN: [id number]  Principal Diagnosis: MDD, recurrent, severe  Secondary Diagnoses: Active Problems:   MDD (major depressive disorder), severe (HCC)   Current Medications:  Current Facility-Administered Medications  Medication Dose Route Frequency Provider Last Rate Last Dose  . alum & mag hydroxide-simeth (MAALOX/MYLANTA) 200-200-20 MG/5ML suspension 30 mL  30 mL Oral Q4H PRN Kerry HoughSimon, Spencer E, PA-C      . carbamazepine (TEGRETOL XR) 12 hr tablet 200 mg  200 mg Oral BID Cobos, Rockey SituFernando A, MD   200 mg at 01/20/19 0807  . gabapentin (NEURONTIN) capsule 100 mg  100 mg Oral TID Cobos, Rockey SituFernando A, MD   100 mg at 01/20/19 0807  . ibuprofen (ADVIL,MOTRIN) tablet 600 mg  600 mg Oral Q6H PRN Kerry HoughSimon, Spencer E, PA-C   600 mg at 01/18/19 2145  . magnesium hydroxide (MILK OF MAGNESIA) suspension 30 mL  30 mL Oral Daily PRN Kerry HoughSimon, Spencer E, PA-C      . multivitamin with minerals tablet 1 tablet  1 tablet Oral Daily Kerry HoughSimon, Spencer E, PA-C   1 tablet at 01/20/19 0807  . nicotine polacrilex (NICORETTE) gum 2 mg  2 mg Oral PRN Cobos, Rockey SituFernando A, MD   2 mg at 01/20/19 0906  . thiamine (B-1) injection 100 mg  100 mg Intramuscular Once Donell SievertSimon, Spencer E, PA-C      . thiamine (VITAMIN B-1) tablet 100 mg  100 mg Oral Daily Donell SievertSimon, Spencer E, PA-C   100 mg at 01/20/19 [id number]  . traZODone (DESYREL) tablet 50 mg  50 mg Oral QHS PRN Cobos, Rockey SituFernando A, MD   50 mg at 01/19/19 2202   PTA Medications: No medications prior to admission.    Patient Stressors: Legal issue Substance abuse  Patient Strengths: Ability for insight Active sense of humor Average or above average intelligence  Treatment Modalities: Medication Management, Group therapy, Case management,  1 to 1 session with clinician, Psychoeducation, Recreational therapy.   Physician Treatment Plan for Primary Diagnosis: MDD, recurrent,  severe Long Term Goal(s): Improvement in symptoms so as ready for discharge Improvement in symptoms so as ready for discharge   Short Term Goals: Ability to identify changes in lifestyle to reduce recurrence of condition will improve Ability to identify and develop effective coping behaviors will improve Ability to maintain clinical measurements within normal limits will improve  Medication Management: Evaluate patient's response, side effects, and tolerance of medication regimen.  Therapeutic Interventions: 1 to 1 sessions, Unit Group sessions and Medication administration.  Evaluation of Outcomes: Progressing  Physician Treatment Plan for Secondary Diagnosis: Active Problems:   MDD (major depressive disorder), severe (HCC)  Long Term Goal(s): Improvement in symptoms so as ready for discharge Improvement in symptoms so as ready for discharge   Short Term Goals: Ability to identify changes in lifestyle to reduce recurrence of condition will improve Ability to identify and develop effective coping behaviors will improve Ability to maintain clinical measurements within normal limits will improve     Medication Management: Evaluate patient's response, side effects, and tolerance of medication regimen.  Therapeutic Interventions: 1 to 1 sessions, Unit Group sessions and Medication administration.  Evaluation of Outcomes: Progressing   RN Treatment Plan for Primary Diagnosis: MDD, recurrent, severe Long Term Goal(s): Knowledge of disease and therapeutic regimen to maintain health will improve  Short Term Goals: Ability to remain free from injury will improve, Ability to participate in decision making will improve and  Ability to disclose and discuss suicidal ideas  Medication Management: RN will administer medications as ordered by provider, will assess and evaluate patient's response and provide education to patient for prescribed medication. RN will report any adverse and/or side  effects to prescribing provider.  Therapeutic Interventions: 1 on 1 counseling sessions, Psychoeducation, Medication administration, Evaluate responses to treatment, Monitor vital signs and CBGs as ordered, Perform/monitor CIWA, COWS, AIMS and Fall Risk screenings as ordered, Perform wound care treatments as ordered.  Evaluation of Outcomes: Progressing   LCSW Treatment Plan for Primary Diagnosis: MDD, recurrent, severe Long Term Goal(s): Safe transition to appropriate next level of care at discharge, Engage patient in therapeutic group addressing interpersonal concerns.  Short Term Goals: Engage patient in aftercare planning with referrals and resources, Facilitate patient progression through stages of change regarding substance use diagnoses and concerns and Identify triggers associated with mental health/substance abuse issues  Therapeutic Interventions: Assess for all discharge needs, 1 to 1 time with Social worker, Explore available resources and support systems, Assess for adequacy in community support network, Educate family and significant other(s) on suicide prevention, Complete Psychosocial Assessment, Interpersonal group therapy.  Evaluation of Outcomes: Progressing   Progress in Treatment: Attending groups: Yes. Participating in groups: Yes. Taking medication as prescribed: Yes. Toleration medication: Yes. Family/Significant other contact made: Yes, individual(s) contacted:  pt's sister Patient understands diagnosis: Yes. Discussing patient identified problems/goals with staff: Yes. Medical problems stabilized or resolved: Yes. Denies suicidal/homicidal ideation: Yes. Issues/concerns per patient self-inventory: No. Other: n/a   New problem(s) identified: No, Describe:  n/a  New Short Term/Long Term Goal(s): detox, medication management for mood stabilization; elimination of SI thoughts; development of comprehensive mental wellness/sobriety plan.   Patient Goals:  "to  work on cutting out drinking and get detoxed. To get help for my depression."   Discharge Plan or Barriers: CSW assessing for appropriate referrals. Pt given information about [facility], [facility], and [facility]. [facility] pamphlet, Mobile Crisis information, and AA/NA information provided to patient for additional community support and resources.   Reason for Continuation of Hospitalization: Anxiety Depression Medication stabilization Withdrawal symptoms  Estimated Length of Stay: Wed, 01/22/2019  Attendees: Patient: Brady Mckinney  01/20/2019 11:19 AM  Physician: Dr. Jama Flavors MD 01/20/2019 11:19 AM  Nursing: Arlyss Repress RN; [facility] RN 01/20/2019 11:19 AM  RN Care Manager:x 01/20/2019 11:19 AM  Social Worker: Corrie Mckusick LCSW 01/20/2019 11:19 AM  Recreational Therapist: x 01/20/2019 11:19 AM  Other: Marciano Sequin NP 01/20/2019 11:19 AM  Other:  01/20/2019 11:19 AM  Other: 01/20/2019 11:19 AM    Scribe for Treatment Team: Rona Ravens, LCSW 01/20/2019 11:19 AM

## 2019-01-20 NOTE — [facility] Group Notes (Signed)
LCSW Group Therapy Note   01/20/2019 1:15pm   Type of Therapy and Topic:  Group Therapy:  Overcoming Obstacles   Participation Level:  Active   Description of Group:    In this group patients will be encouraged to explore what they see as obstacles to their own wellness and recovery. They will be guided to discuss their thoughts, feelings, and behaviors related to these obstacles. The group will process together ways to cope with barriers, with attention given to specific choices patients can make. Each patient will be challenged to identify changes they are motivated to make in order to overcome their obstacles. This group will be process-oriented, with patients participating in exploration of their own experiences as well as giving and receiving support and challenge from other group members.   Therapeutic Goals: 1. Patient will identify personal and current obstacles as they relate to admission. 2. Patient will identify barriers that currently interfere with their wellness or overcoming obstacles.  3. Patient will identify feelings, thought process and behaviors related to these barriers. 4. Patient will identify two changes they are willing to make to overcome these obstacles:      Summary of Patient Progress   Brady Mckinney was attentive and engaged. He shared that his biggest obstacle is finding a sober living and safe environment for himself at discharge. He was disheartened to learn that [facility] cannot screen him until 2/19. He was receptive to calling [facility] this evening and shared that he has a sister who will allow him to stay with her until getting into [facility]. Brady Mckinney demonstrates improvement in the group setting with improving insight. Some anxiety noted in social settings. Pt otherwise pleasant and cooperative.    Therapeutic Modalities:   Cognitive Behavioral Therapy Solution Focused Therapy Motivational Interviewing Relapse Prevention Therapy  Rona Ravens,  LCSW 01/20/2019 3:21 PM

## 2019-01-20 NOTE — Progress Notes (Signed)
Recreation Therapy Notes  Date:  2.10.20 Time: 0930 Location: [address] Dayroom  Group Topic: Stress Management  Goal Area(s) Addresses:  Patient will identify positive stress management techniques. Patient will identify benefits of using stress management post d/c.  Behavioral Response: Engaged  Intervention: Stress Management  Activity :  Meditation.  LRT introduced the stress management technique of meditation.  LRT played a meditation that focused on being resilient in the face of adversity.  Patients were to listen as meditation played to engage in activity.    Education:  Stress Management, Discharge Planning.   Education Outcome: Acknowledges Education  Clinical Observations/Feedback:  Pt attended and participated in activity.    Patrisha Hausmann, LRT/CTRS         Waylynn Benefiel A 01/20/2019 12:13 PM 

## 2019-01-20 NOTE — Progress Notes (Signed)
Individual session with Karenann Cai. Pt discussed in greater detail the grief he shared about in group. Pt identified that a culmination of a recent break up, a loss of housing, loss of job, and anniversary of his fiance's suicide all contributed to his increased alcohol use and subsequent suicide attempt. Pt reported feeling like group was a good space to feel less alone in his group and that he wished more coping skills had been discussed for dealing with grief.  Counselor Ethel Rana, MS, [facility], [facility] explored coping skills with pt. Pt identified healthy coping skills, including biking and playing guitar. Pt identified his stepmother as a positive influence in his life. Pt identified using video games as a coping skill and reported this coping skill is not healthy as he will mindlessly drink to the point of intoxication while playing. Counselor affirmed pt's ability to distinguish between healthy and unhealthy coping.   Pt discussed two forms of grieving: the acute grief from his fiance's suicide and the chronic grief from his mother's worsening bipolar symptoms. Pt reported grieving the "could've beens" and counselor validated this emotion. Pt shared his plan following [facility] and reported he feels ready for the next step.  Counselor will check in with pt Tuesday, 01/21/2019.

## 2019-01-20 NOTE — Progress Notes (Signed)
Patient attended grief and loss group facilitated by Ethel Rana, MS, [facility], [facility].   Group focuses on change and loss experienced by group members; topics include loss due to death, loss of relationships, loss of a place, loss of sense of self, etc. Group members are invited to share the changes and losses that are currently affecting their lives. Facilitators tie together shared experiences between group members and validate emotions felt by participants.  Patient Brady Mckinney (requested to be called "Brady Mckinney") attended and participated in group. Pt shared his experience losing his fiance two years ago. Pt discussed grieving what could have been, including grieving the family he did not get the opportunity to build with his fiance. 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 Pt connected his experiences to other group members and worked to include everyone in group discussion.

## 2019-01-20 NOTE — Progress Notes (Signed)
D: Patient observed up and visible in the dayroom. Working on puzzles which he states help his anxiety, nicotine cravings. Patient states, "I'll feel better once I know my discharge plan. 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 Denies pain, physical complaints. States, "I think they withdrawal is pretty much over."   A: Medicated per orders, prn vistaril, trazadone given per his request. Medication education provided. Level III obs in place for safety. Emotional support offered. Patient encouraged to complete Suicide Safety Plan before discharge. Encouraged to attend and participate in unit programming.  Fall prevention plan in place and reviewed with patient as pt is a high fall risk due to detox.   R: Patient verbalizes understanding of POC, falls prevention education. On reassess, patient is resting in bed. Patient denies SI/HI/AVH and remains safe on level III obs. Will continue to monitor throughout the night.

## 2019-01-20 NOTE — Plan of Care (Addendum)
Patient self inventory- Patient slept well last night, sleep mediaction was requested and was helpful. Appetite is good, concentration good, energy level normal. Depression, hopelessness, and anxiety rated 0, 0, 4 out of 10. Denies withdrawal symptoms, endorses nicotine cravings. Denies SI HI AVH. Denies physical problems. Patient's goal is to work on "my discharge plan."  Patient is compliant with medications prescribed per provider.  Safety is maintained with 15 minute checks as well as environmental checks. Will continue to monitor.  Problem: Education: Goal: Knowledge of [facility] Education information/materials will improve Outcome: Progressing Goal: Emotional status will improve Outcome: Progressing Goal: Mental status will improve Outcome: Progressing Goal: Verbalization of understanding the information provided will improve Outcome: Progressing   Problem: Activity: Goal: Sleeping patterns will improve Outcome: Progressing   Problem: Coping: Goal: Ability to verbalize frustrations and anger appropriately will improve Outcome: Progressing

## 2019-01-21 NOTE — Progress Notes (Signed)
Patient ID: Brady Mckinney, male   DOB: 1991-07-05, 28 y.o.   MRN: [id number]   D: Patient pleasant on approach tonight. Interacting well with others in dayroom. Reports he is just stressed about his discharge plans but doing well otherwise. No active SI tonight. A: Staff will monitor on q 15 minute checks follow treatment plan, and give medications as ordered. R: Cooperative and attending group tonight.

## 2019-01-21 NOTE — Plan of Care (Signed)
  Problem: Activity: Goal: Interest or engagement in activities will improve Outcome: Progressing   Problem: Health Behavior/Discharge Planning: Goal: Compliance with treatment plan for underlying cause of condition will improve Outcome: Progressing   Problem: Safety: Goal: Periods of time without injury will increase Outcome: Progressing   

## 2019-01-21 NOTE — Progress Notes (Signed)
Patient ID: Brady Mckinney, male   DOB: 01/22/1991, 28 y.o.   MRN: [id number]  Nursing Progress Note [id number]  Data: On initial approach, patient is seen up interacting with peers in the dayroom. Patient presents animated and pleasant. Patient compliant with scheduled medications. Patient denies pain/physical complaints. Patient completed self-inventory sheet and rates depression, hopelessness, and anxiety 0,0,2 respectively. Patient rates their sleep and appetite as good/good respectively. Patient states goal for today is to "finalize my discharge plan". Patient is seen attending groups and visible in the milieu. Patient currently denies SI/HI/AVH.   Action: Patient is educated about and provided medication per provider's orders. Patient safety maintained with q15 min safety checks and frequent rounding. Low fall risk precautions in place. Emotional support given. 1:1 interaction and active listening provided. Patient encouraged to attend meals, groups, and work on treatment plan and goals. Labs, vital signs and patient behavior monitored throughout shift.   Response: Patient remains safe on the unit at this time and agrees to come to staff with any issues/concerns. Patient is interacting with peers appropriately on the unit. Will continue to support and monitor.

## 2019-01-21 NOTE — Progress Notes (Signed)
[facility]  01/21/2019 1:12 PM Brady Mckinney  MRN:  [id number] Subjective:  "I'm doing better."  [name] socializing in the dayroom. Presents with bright affect. He reports mood continues to improve. Anxiety levels have decreased- his sister has agreed to let him stay with her until Brady Mckinney screening next week. Presents as future-oriented. Sleep and appetite improved. Denies withdrawal symptoms. Denies medication side effects.  From admission H&P: 28 year old male, presented to ED voluntarily on 2/6, reporting worsening depression, feeling hopeless, and having suicidal ideations, with thoughts of jumping in front of a truck. He reports history of intermittent depression since childhood, but states he has been more depressed over the last few weeks, and states he has been facing some significant stressors over recent weeks- homelessness, recent break up, losing job recently, recent anniversary of prior GF's death , who died by overdose 2 year ago. He has also been drinking daily, usually about 4 beers ( 48 ounces ) per day. Admission BAL 453,admission UDS(+) for Cannabis. Endorses neuro-vegetative symptoms as below.  Principal Problem: Alcohol abuse Diagnosis: Principal Problem:   Alcohol abuse Active Problems:   MDD (major depressive disorder), severe (HCC)  Total Time spent with patient: 15 minutes  Past Psychiatric History: See admission H&P  Past Medical History:  Past Medical History:  Diagnosis Date  . Anxiety   . Bipolar 1 disorder (HCC)   . Schizoaffective disorder (HCC)    History reviewed. No pertinent surgical history. Family History:  Family History  Problem Relation Age of Onset  . Bipolar disorder Mother   . Schizophrenia Father    Family Psychiatric  History: See admission H&P Social History:  Social History   Substance and Sexual Activity  Alcohol Use Yes   Comment: daily      Social History   Substance and Sexual Activity  Drug Use Never     Social History   Socioeconomic History  . Marital status: Single    Spouse name: Not on file  . Number of children: Not on file  . Years of education: Not on file  . Highest education level: Not on file  Occupational History  . Not on file  Social Needs  . Financial resource strain: Not on file  . Food insecurity:    Worry: Not on file    Inability: Not on file  . Transportation needs:    Medical: Not on file    Non-medical: Not on file  Tobacco Use  . Smoking status: Current Every Day Smoker    Packs/day: 0.45    Types: Cigarettes  . Smokeless tobacco: Never Used  Substance and Sexual Activity  . Alcohol use: Yes    Comment: daily   . Drug use: Never  . Sexual activity: Not on file  Lifestyle  . Physical activity:    Days per week: Not on file    Minutes per session: Not on file  . Stress: Not on file  Relationships  . Social connections:    Talks on phone: Not on file    Gets together: Not on file    Attends religious service: Not on file    Active member of club or organization: Not on file    Attends meetings of clubs or organizations: Not on file    Relationship status: Not on file  Other Topics Concern  . Not on file  Social History Narrative  . Not on file   Additional Social History:  Sleep: Good  Appetite:  Good  Current Medications: Current Facility-Administered Medications  Medication Dose Route Frequency Provider Last Rate Last Dose  . alum & mag hydroxide-simeth (MAALOX/MYLANTA) 200-200-20 MG/5ML suspension 30 mL  30 mL Oral Q4H PRN Kerry Hough, PA-C      . carbamazepine (TEGRETOL XR) 12 hr tablet 200 mg  200 mg Oral BID Cobos, Rockey Situ, MD   200 mg at 01/21/19 0818  . gabapentin (NEURONTIN) capsule 100 mg  100 mg Oral TID Cobos, Rockey Situ, MD   100 mg at 01/21/19 1215  . hydrOXYzine (ATARAX/VISTARIL) tablet 25 mg  25 mg Oral Q6H PRN Aldean Baker, NP   25 mg at 01/20/19 2155  . ibuprofen  (ADVIL,MOTRIN) tablet 600 mg  600 mg Oral Q6H PRN Kerry Hough, PA-C   600 mg at 01/18/19 2145  . magnesium hydroxide (MILK OF MAGNESIA) suspension 30 mL  30 mL Oral Daily PRN Kerry Hough, PA-C      . multivitamin with minerals tablet 1 tablet  1 tablet Oral Daily Kerry Hough, PA-C   1 tablet at 01/21/19 0818  . nicotine polacrilex (NICORETTE) gum 2 mg  2 mg Oral PRN Cobos, Rockey Situ, MD   2 mg at 01/21/19 0818  . sodium chloride (OCEAN) 0.65 % nasal spray 1 spray  1 spray Each Nare PRN Aldean Baker, NP      . thiamine (B-1) injection 100 mg  100 mg Intramuscular Once Donell Sievert E, PA-C      . thiamine (VITAMIN B-1) tablet 100 mg  100 mg Oral Daily Donell Sievert E, PA-C   100 mg at 01/21/19 0818  . traZODone (DESYREL) tablet 50 mg  50 mg Oral QHS PRN Cobos, Rockey Situ, MD   50 mg at 01/20/19 2155    Lab Results:  Results for orders placed or performed during the hospital encounter of 01/17/19 (from the past 48 hour(s))  Carbamazepine level, total     Status: None   Collection Time: 01/20/19  6:42 AM  Result Value Ref Range   Carbamazepine Lvl 4.7 4.0 - 12.0 ug/mL    Comment: Performed at [facility], [address] [place], [place] [address]    Blood Alcohol level:  Lab Results  Component Value Date   ETH 453 [facility]) 01/16/2019    Metabolic Disorder Labs: Lab Results  Component Value Date   HGBA1C 5.2 01/18/2019   MPG 102.54 01/18/2019   No results found for: PROLACTIN No results found for: CHOL, TRIG, HDL, CHOLHDL, VLDL, LDLCALC  Physical Findings: AIMS: Facial and Oral Movements Muscles of Facial Expression: None, normal Lips and Perioral Area: None, normal Jaw: None, normal Tongue: None, normal,Extremity Movements Upper (arms, wrists, hands, fingers): None, normal Lower (legs, knees, ankles, toes): None, normal, Trunk Movements Neck, shoulders, hips: None, normal, Overall Severity Severity of abnormal movements (highest score from questions  above): None, normal Incapacitation due to abnormal movements: None, normal Patient's awareness of abnormal movements (rate only patient's report): No Awareness, Dental Status Current problems with teeth and/or dentures?: No Does patient usually wear dentures?: No  CIWA:  CIWA-Ar Total: 0 COWS:     Musculoskeletal: Strength & Muscle Tone: within normal limits Gait & Station: normal Patient leans: N/A  Psychiatric Specialty Exam: Physical Exam  Nursing note and vitals reviewed. Constitutional: He is oriented to person, place, and time. He appears well-developed and well-nourished.  Cardiovascular: Normal rate.  Respiratory: Effort normal.  Neurological: He is  alert and oriented to person, place, and time.    Review of Systems  Constitutional: Negative.   HENT: Negative.   Psychiatric/Behavioral: Positive for depression (improving) and substance abuse (ETOH). Negative for hallucinations, memory loss and suicidal ideas. The patient is not nervous/anxious and does not have insomnia.     Blood pressure 123/84, pulse 75, temperature 97.6 F (36.4 C), temperature source Oral, resp. rate 16, height 5\' 8"  (1.727 m), weight 65.8 kg, SpO2 100 %.Body mass index is 22.05 kg/m.  General Appearance: Casual  Eye Contact:  Good  Speech:  Clear and Coherent and Normal Rate  Volume:  Normal  Mood:  Euthymic  Affect:  Congruent  Thought Process:  Coherent  Orientation:  Full (Time, Place, and Person)  Thought Content:  WDL  Suicidal Thoughts:  No  Homicidal Thoughts:  No  Memory:  Immediate;   Good Recent;   Good  Judgement:  Fair  Insight:  Fair  Psychomotor Activity:  Normal  Concentration:  Concentration: Good  Recall:  Good  Fund of Knowledge:  Fair  Language:  Good  Akathisia:  No  Handed:  Right  AIMS (if indicated):     Assets:  Communication Skills Desire for Improvement Physical Health Resilience Social Support  ADL's:  Intact  Cognition:  WNL  Sleep:  Number of Hours:  6     Treatment Plan Summary: Daily contact with patient to assess and evaluate symptoms and progress in treatment and Medication management   Continue inpatient hospitalization.   Continue carbamazepine 200 mg PO QAM, 400 mg PO QHS for mood Continue gabapentin 100 mg PO TID for ETOH withdrawal, anxiety Continue Vistaril 25 mg Q6HR PRN anxiety Continue trazodone 50 mg PO QHS PRN insomnia  Patient will participate in the therapeutic group milieu.  Discharge disposition in progress.   Aldean Baker, NP 01/21/2019, 1:12 PM

## 2019-01-21 NOTE — Progress Notes (Signed)
Nursing Progress Note: 7p-7a D: Pt currently presents with a pleasant/euthymic affect and behavior. Pt states "I am worried about where I am going to go from here." Interacting appropriately with the milieu. Pt reports good sleep during the previous night with current medication regimen. Pt did attend wrap-up group.  A: Pt provided with medications per providers orders. Pt's labs and vitals were monitored throughout the night. Pt supported emotionally and encouraged to express concerns and questions. Pt educated on medications.  R: Pt's safety ensured with 15 minute and environmental checks. Pt currently denies SI, HI, and AVH. Pt verbally contracts to seek staff if SI,HI, or AVH occurs and to consult with staff before acting on any harmful thoughts. Will continue to monitor.

## 2019-01-21 NOTE — [facility] Group Notes (Signed)
[facility] [employer] Group Therapy 01/21/2019 1:15pm  Type of Therapy: [employer] Presentation  Participation Level: Active  Participation Quality: Attentive  Affect: Appropriate  Cognitive: Oriented  Insight: Developing/Improving  Engagement in Therapy: Engaged  Modes of Intervention: Discussion, Education and Socialization  Summary of Progress/Problems: [employer] (MHA) Speaker came to talk about his personal journey with mental health. The pt processed ways by which to relate to the speaker. MHA speaker provided handouts and educational information pertaining to groups and services offered by the MHA. Pt was engaged in speaker's presentation and was receptive to resources provided.    Brady Mckinney S Brady Bey, LCSW 01/21/2019 2:08 PM  

## 2019-01-22 MED ORDER — TRAZODONE HCL 50 MG PO TABS
50.0000 mg | ORAL_TABLET | Freq: Every evening | ORAL | Status: DC | PRN
  Administered 2019-01-22: 50 mg via ORAL
  Filled 2019-01-22: qty 14

## 2019-01-22 NOTE — Progress Notes (Signed)
Recreation Therapy Notes  Date:  2.12.20 Time: 0930 Location: [address] Dayroom  Group Topic: Stress Management  Goal Area(s) Addresses:  Patient will identify positive stress management techniques. Patient will identify benefits of using stress management post d/c.  Behavioral Response: Engaged  Intervention:  Stress Management  Activity :  Guided Imagery.  LRT introduced the stress management technique of guided imagery.  LRT read a script that guided patients to envision their peaceful place.  Patients were to listen and follow along as script was read.  Education:  Stress Management, Discharge Planning.   Education Outcome: Acknowledges Education  Clinical Observations/Feedback: Pt attended and participated in group.     Caroll Rancher, LRT/CTRS         Caroll Rancher A 01/22/2019 10:52 AM

## 2019-01-22 NOTE — Progress Notes (Addendum)
Nursing Progress Note: 7p-7a D: Pt currently presents with a anxious/euthymic affect and behavior. Interacting appropriately with the milieu. Pt reports good sleep during the previous night with current medication regimen. Pt did attend wrap-up group.  A: Pt provided with medications per providers orders. Pt's labs and vitals were monitored throughout the night. Pt supported emotionally and encouraged to express concerns and questions. Pt educated on medications.  R: Pt's safety ensured with 15 minute and environmental checks. Pt currently denies SI, HI, and AVH. Pt verbally contracts to seek staff if SI,HI, or AVH occurs and to consult with staff before acting on any harmful thoughts. Will continue to monitor.

## 2019-01-22 NOTE — Progress Notes (Addendum)
CSW received calls from patient's father and a family friend who identified herself as "Jacklyn Shell," asking information regarding patient's discharge plan. CSW explained she did not have consent to speak with them at this time.  CSW met with patient, shared that calls were received. Patient does not give permission for CSW to speak with father; agreeable to [place] speaking with Jacklyn Shell.   Patient states he is not aware of his discharge plan. CSW aware that patient met with RN Estill Bamberg and CSW Nira Conn this morning to review plan; patient confirms he did. CSW reviewed patient's follow up appointments a second time, patient states he is still confused but did not cite why he is confused and did not have specific questions.  Patient says he was unaware he would be discharging soon. Patient planned to sign a 72 hour and leave on Monday, CSW explained the process and informed patient he should prepare for discharge tomorrow. Patient asked, regarding discharge tomorrow, "unless I do something to prove I'm suicidal, right?" Patient denies a plan to harm self or attempt suicide. CSW informed nurse and psychiatry.   Patient shared that he has a "bench warrant" for missing a court date before he came to [facility]. Patient says he just has to pay a fine and the matter will be resolved. CSW asked patient to call [facility] to confirm. Patient states he has, he says he will follow up with the sheriff at discharge.    CSW spoke with Jacklyn Shell, overviewed discharge plan with outpatient follow up information. Vaughan Basta verbalized understanding. She says she will reach out to family to see if patient can stay with a family member prior to his [facility] screening. Vaughan Basta says she can pick up the patient from [facility] tomorrow at [place], [place] Social Worker

## 2019-01-22 NOTE — Progress Notes (Signed)
Patient ID: Brady Mckinney, male   DOB: [birth year], 28 y.o.   MRN: [id number]  Nursing Progress Note [id number]  Data: On initial approach, patient is seen up interacting with peers in the milieu. Patient presents calm, pleasant and cooperative. Patient compliant with scheduled medications. Patient denies pain/physical complaints. Patient completed self-inventory sheet and rates depression, hopelessness, and anxiety 0,0,5 respectively. Patient rates their sleep and appetite as good/good respectively. Patient states goal for today is to "work on my discharge plan". Writer and SW met with patient to discuss discharge plan for tomorrow. Patient states, "my step mom said she could put me up in a hotel for a few days until my [facility] screening Wed". Patient agreeable to discharge tomorrow and reports he will arrange transportation. Patient is seen attending groups and visible in the milieu. Patient currently denies SI/HI/AVH.   Action: Patient is educated about and provided medication per provider's orders. Patient safety maintained with q15 min safety checks and frequent rounding. Low fall risk precautions in place. Emotional support given. 1:1 interaction and active listening provided. Patient encouraged to attend meals, groups, and work on treatment plan and goals. Labs, vital signs and patient behavior monitored throughout shift.   Response: Patient remains safe on the unit at this time and agrees to come to staff with any issues/concerns. Patient is interacting with peers appropriately on the unit. Will continue to support and monitor.

## 2019-01-22 NOTE — Progress Notes (Signed)
[facility]  01/22/2019 11:39 AM Brady Mckinney  MRN:  [id number] Subjective:  "I'm antsy about discharging."  Brady Mckinney sitting in dayroom. He reports increased worry and depressed mood related to discharge concerns for housing. His sister will not allow him to stay with her before his [facility] screening next week. He found out last night and had difficulty sleeping as a result. His stepmom may be able to pay for a hotel room for him. He will be in touch with stepmom this morning. Patient states understanding of plan for discharge tomorrow. Denies SI. Denies medication side effects. Participating in groups, visible in the milieu.  From admission H&P:28 year old male, presented to ED voluntarily on 2/6, reporting worsening depression, feeling hopeless, and having suicidal ideations, with thoughts of jumping in front of a truck. He reports history of intermittent depression since childhood, but states he has been more depressed over the last few weeks, and states he has been facing some significant stressors over recent weeks- homelessness, recent break up, losing job recently, recent anniversary of prior GF's death , who died by overdose 2 year ago. He has also been drinking daily, usually about 4 beers ( 48 ounces ) per day. Admission BAL 453,admission UDS(+) for Cannabis.Endorses neuro-vegetative symptoms as below.  Principal Problem: Alcohol abuse Diagnosis: Principal Problem:   Alcohol abuse Active Problems:   MDD (major depressive disorder), severe (HCC)  Total Time spent with patient: 15 minutes  Past Psychiatric History: See admission H&P  Past Medical History:  Past Medical History:  Diagnosis Date  . Anxiety   . Bipolar 1 disorder (HCC)   . Schizoaffective disorder (HCC)    History reviewed. No pertinent surgical history. Family History:  Family History  Problem Relation Age of Onset  . Bipolar disorder Mother   . Schizophrenia Father    Family Psychiatric   History: See admission H&P Social History:  Social History   Substance and Sexual Activity  Alcohol Use Yes   Comment: daily      Social History   Substance and Sexual Activity  Drug Use Never    Social History   Socioeconomic History  . Marital status: Single    Spouse name: Not on file  . Number of children: Not on file  . Years of education: Not on file  . Highest education level: Not on file  Occupational History  . Not on file  Social Needs  . Financial resource strain: Not on file  . Food insecurity:    Worry: Not on file    Inability: Not on file  . Transportation needs:    Medical: Not on file    Non-medical: Not on file  Tobacco Use  . Smoking status: Current Every Day Smoker    Packs/day: 0.45    Types: Cigarettes  . Smokeless tobacco: Never Used  Substance and Sexual Activity  . Alcohol use: Yes    Comment: daily   . Drug use: Never  . Sexual activity: Not on file  Lifestyle  . Physical activity:    Days per week: Not on file    Minutes per session: Not on file  . Stress: Not on file  Relationships  . Social connections:    Talks on phone: Not on file    Gets together: Not on file    Attends religious service: Not on file    Active member of club or organization: Not on file    Attends meetings of clubs or  organizations: Not on file    Relationship status: Not on file  Other Topics Concern  . Not on file  Social History Narrative  . Not on file   Additional Social History:                         Sleep: Fair  Appetite:  Good  Current Medications: Current Facility-Administered Medications  Medication Dose Route Frequency Provider Last Rate Last Dose  . alum & mag hydroxide-simeth (MAALOX/MYLANTA) 200-200-20 MG/5ML suspension 30 mL  30 mL Oral Q4H PRN Kerry Hough, PA-C      . carbamazepine (TEGRETOL XR) 12 hr tablet 200 mg  200 mg Oral BID Cobos, Rockey Situ, MD   200 mg at 01/22/19 0755  . gabapentin (NEURONTIN) capsule  100 mg  100 mg Oral TID Cobos, Rockey Situ, MD   100 mg at 01/22/19 0755  . hydrOXYzine (ATARAX/VISTARIL) tablet 25 mg  25 mg Oral Q6H PRN Aldean Baker, NP   25 mg at 01/21/19 2231  . ibuprofen (ADVIL,MOTRIN) tablet 600 mg  600 mg Oral Q6H PRN Kerry Hough, PA-C   600 mg at 01/18/19 2145  . magnesium hydroxide (MILK OF MAGNESIA) suspension 30 mL  30 mL Oral Daily PRN Kerry Hough, PA-C      . multivitamin with minerals tablet 1 tablet  1 tablet Oral Daily Kerry Hough, PA-C   1 tablet at 01/22/19 0755  . nicotine polacrilex (NICORETTE) gum 2 mg  2 mg Oral PRN Cobos, Rockey Situ, MD   2 mg at 01/22/19 0756  . sodium chloride (OCEAN) 0.65 % nasal spray 1 spray  1 spray Each Nare PRN Aldean Baker, NP      . thiamine (B-1) injection 100 mg  100 mg Intramuscular Once Donell Sievert E, PA-C      . thiamine (VITAMIN B-1) tablet 100 mg  100 mg Oral Daily Donell Sievert E, PA-C   100 mg at 01/22/19 0755  . traZODone (DESYREL) tablet 50 mg  50 mg Oral QHS PRN Cobos, Rockey Situ, MD   50 mg at 01/21/19 2231    Lab Results: No results found for this or any previous visit (from the past 48 hour(s)).  Blood Alcohol level:  Lab Results  Component Value Date   ETH 453 (HH) 01/16/2019    Metabolic Disorder Labs: Lab Results  Component Value Date   HGBA1C 5.2 01/18/2019   MPG 102.54 01/18/2019   No results found for: PROLACTIN No results found for: CHOL, TRIG, HDL, CHOLHDL, VLDL, LDLCALC  Physical Findings: AIMS: Facial and Oral Movements Muscles of Facial Expression: None, normal Lips and Perioral Area: None, normal Jaw: None, normal Tongue: None, normal,Extremity Movements Upper (arms, wrists, hands, fingers): None, normal Lower (legs, knees, ankles, toes): None, normal, Trunk Movements Neck, shoulders, hips: None, normal, Overall Severity Severity of abnormal movements (highest score from questions above): None, normal Incapacitation due to abnormal movements: None,  normal Patient's awareness of abnormal movements (rate only patient's report): No Awareness, Dental Status Current problems with teeth and/or dentures?: No Does patient usually wear dentures?: No  CIWA:  CIWA-Ar Total: 0 COWS:     Musculoskeletal: Strength & Muscle Tone: within normal limits Gait & Station: normal Patient leans: N/A  Psychiatric Specialty Exam: Physical Exam  Nursing note and vitals reviewed. Constitutional: He is oriented to person, place, and time. He appears well-developed and well-nourished.  Cardiovascular: Normal rate.  Respiratory: Effort normal.  Neurological: He is alert and oriented to person, place, and time.    Review of Systems  Constitutional: Negative.   Psychiatric/Behavioral: Positive for depression and substance abuse (ETOH). Negative for hallucinations, memory loss and suicidal ideas. The patient is nervous/anxious. The patient does not have insomnia.     Blood pressure 126/80, pulse (!) 102, temperature 97.6 F (36.4 C), temperature source Oral, resp. rate 16, height 5\' 8"  (1.727 m), weight 65.8 kg, SpO2 100 %.Body mass index is 22.05 kg/m.  General Appearance: Casual  Eye Contact:  Good  Speech:  Clear and Coherent  Volume:  Normal  Mood:  Euthymic  Affect:  Congruent  Thought Process:  Coherent  Orientation:  Full (Time, Place, and Person)  Thought Content:  WDL  Suicidal Thoughts:  No  Homicidal Thoughts:  No  Memory:  Immediate;   Good Recent;   Good  Judgement:  Fair  Insight:  Fair  Psychomotor Activity:  Normal  Concentration:  Concentration: Good  Recall:  Good  Fund of Knowledge:  Fair  Language:  Good  Akathisia:  No  Handed:  Right  AIMS (if indicated):     Assets:  Communication Skills Desire for Improvement Physical Health Resilience Social Support  ADL's:  Intact  Cognition:  WNL  Sleep:  Number of Hours: 4.5     Treatment Plan Summary: Daily contact with patient to assess and evaluate symptoms and  progress in treatment and Medication management   Continue inpatient hospitalization.  Continue Tegretol XR 200 mg PO BID for mood Continue gabapentin 100 mg PO TID for ETOH withdrawal Continue Vistaril 25 mg PO Q6HR PRN anxiety Continue trazodone 50 mg PO QHS PRN insomnia  Patient will participate in the therapeutic group milieu.  Discharge disposition in progress.    Aldean Baker, NP 01/22/2019, 11:39 AM

## 2019-01-23 MED ORDER — NICOTINE POLACRILEX 2 MG MT GUM: 2.0000 mg | CHEWING_GUM | OROMUCOSAL | 0 refills | Status: DC | PRN

## 2019-01-23 MED ORDER — CARBAMAZEPINE ER 200 MG PO TB12: 200.0000 mg | ORAL_TABLET | Freq: Two times a day (BID) | ORAL | 0 refills | Status: DC

## 2019-01-23 MED ORDER — SALINE SPRAY 0.65 % NA SOLN: 1.0000 | NASAL | 0 refills | Status: DC | PRN

## 2019-01-23 MED ORDER — HYDROXYZINE HCL 25 MG PO TABS: 25.0000 mg | ORAL_TABLET | Freq: Four times a day (QID) | ORAL | 0 refills | Status: DC | PRN

## 2019-01-23 MED ORDER — ADULT MULTIVITAMIN W/MINERALS CH: 1.0000 | ORAL_TABLET | Freq: Every day | ORAL | 0 refills | Status: AC

## 2019-01-23 MED ORDER — GABAPENTIN 100 MG PO CAPS: 100.0000 mg | ORAL_CAPSULE | Freq: Three times a day (TID) | ORAL | 0 refills | Status: DC

## 2019-01-23 MED ORDER — TRAZODONE HCL 50 MG PO TABS: 50.0000 mg | ORAL_TABLET | Freq: Every evening | ORAL | 0 refills | Status: DC | PRN

## 2019-01-23 NOTE — Progress Notes (Signed)
  [facility] Adult Case Management Discharge Plan :  Will you be returning to the same living situation after discharge:  No. Provided shelter resources. At discharge, do you have transportation home?: Yes,  Friend, Brady Mckinney, picking up after 2pm Do you have the ability to pay for your medications: No. Referred to [facility].  Release of information consent forms completed and in the chart; letters on chart for patient.  Patient to Follow up at: Follow-up Information    [facility] Follow up on 01/29/2019.   Why:  Screening for possible admission on Wed, 2/19 at 7:45AM. Please bring: photo ID/proof of [place] residency, 30 day supply of medications, and clothing. You may also walk in Mon/Tues at 7:45AM for walk-in screening. Thank you.  Contact information: [address] [place] [address] [phone]        [facility] Follow up on 01/27/2019.   Specialty:  Behavioral Health Why:  Hospital follow up appointment is Monday, 2/17 at 9:30a. Please bring photo ID, proof of insurance, current medications and discharge paperwork from this hositalization.  Contact information: [address] [place] [address] [phone]           Next level of care provider has access to [facility] Link:no  Safety Planning and Suicide Prevention discussed: Yes,  with sister, Brady Mckinney  Have you used any form of tobacco in the last 30 days? (Cigarettes, Smokeless Tobacco, Cigars, and/or Pipes): Yes  Has patient been referred to the Quitline?: Patient refused referral  Patient has been referred for addiction treatment: Yes  Darreld Mclean, LCSWA 01/23/2019, 9:23 AM

## 2019-01-23 NOTE — [facility] Suicide Risk Assessment (Signed)
[facility] Discharge Suicide Risk Assessment   Principal Problem: Alcohol abuse Discharge Diagnoses: Principal Problem:   Alcohol abuse Active Problems:   MDD (major depressive disorder), severe (HCC)   Total Time spent with patient: 15 minutes  Musculoskeletal: Strength & Muscle Tone: within normal limits Gait & Station: normal Patient leans: N/A  Psychiatric Specialty Exam: Review of Systems  All other systems reviewed and are negative.   Blood pressure 102/69, pulse 86, temperature (!) 97.4 F (36.3 C), temperature source Oral, resp. rate 16, height 5\' 8"  (1.727 m), weight 65.8 kg, SpO2 100 %.Body mass index is 22.05 kg/m.  General Appearance: Casual  Eye Contact::  Fair  Speech:  Normal Rate409  Volume:  Normal  Mood:  Euthymic  Affect:  Congruent  Thought Process:  Coherent and Descriptions of Associations: Intact  Orientation:  Full (Time, Place, and Person)  Thought Content:  Logical  Suicidal Thoughts:  No  Homicidal Thoughts:  No  Memory:  Immediate;   Fair Recent;   Fair Remote;   Fair  Judgement:  Intact  Insight:  Fair  Psychomotor Activity:  Normal  Concentration:  Fair  Recall:  [employer] of Knowledge:Fair  Language: Good  Akathisia:  Negative  Handed:  Right  AIMS (if indicated):     Assets:  Communication Skills Desire for Improvement Financial Resources/Insurance Housing Physical Health Resilience Social Support  Sleep:  Number of Hours: 3.75  Cognition: WNL  ADL's:  Intact   Mental Status Per Nursing Assessment::   On Admission:  Suicidal ideation indicated by patient, Suicide plan  Demographic Factors:  Male, Adolescent or young adult, Caucasian, Low socioeconomic status and Unemployed  Loss Factors: NA  Historical Factors: Impulsivity  Risk Reduction Factors:   Living with another person, especially a relative and Positive social support  Continued Clinical Symptoms:  Bipolar Disorder:   Bipolar II Alcohol/Substance  Abuse/Dependencies  Cognitive Features That Contribute To Risk:  None    Suicide Risk:  Minimal: No identifiable suicidal ideation.  Patients presenting with no risk factors but with morbid ruminations; may be classified as minimal risk based on the severity of the depressive symptoms  Follow-up Information    [facility] Follow up on 01/29/2019.   Why:  Screening for possible admission on Wed, 2/19 at 7:45AM. Please bring: photo ID/proof of [place] residency, 30 day supply of medications, and clothing. You may also walk in Mon/Tues at 7:45AM for walk-in screening. Thank you.  Contact information: [address] [place] [address] [phone]        [facility] Follow up on 01/27/2019.   Specialty:  Behavioral Health Why:  Hospital follow up appointment is Monday, 2/17 at 9:30a. Please bring photo ID, proof of insurance, current medications and discharge paperwork from this hositalization.  Contact information: [address] [place] [address] [phone]           Plan Of Care/Follow-up recommendations:  Activity:  ad lib  Antonieta Pert, MD 01/23/2019, 7:34 AM

## 2019-01-23 NOTE — Progress Notes (Signed)
Discharge note  Patient verbalizes readiness for discharge. Follow up plan explained, AVS, Transition record and SRA given. Prescriptions and teaching provided. Belongings returned and signed for. Suicide safety plan completed and signed. Patient verbalizes understanding. Patient denies SI/HI and assures this Clinical research associate he will seek assistance should that change. Patient discharged to lobby where mother in law was waiting.

## 2019-01-23 NOTE — Discharge Summary (Signed)
Physician Discharge Summary Note  Patient:  Brady Mckinney is an 28 y.o., male MRN:  [id number] DOB:  1991/11/27 Patient phone:  [phone] (home)  Patient address:   [address] [place] [address],  Total Time spent with patient: 15 minutes  Date of Admission:  01/17/2019 Date of Discharge: 01/23/2019  Reason for Admission:  Suicidal ideation, alcohol abuse  Principal Problem: Alcohol abuse Discharge Diagnoses: Principal Problem:   Alcohol abuse Active Problems:   MDD (major depressive disorder), severe (HCC)   Past Psychiatric History: From admission H&P: No prior psychiatric admissions, history of prior suicide attempts as teenager by overdosing , walking into traffic, most recently at age 49. Reports prior history of Bipolar Disorder and reports history of intermittent depressive episodes and brief episodes of feeling more energetic, jovial, feeling hypersexual and more sociable, decreased need for sleep. Denies history of panic or agoraphobia. Denies history of psychosis Denies history of violence .  Past Medical History:  Past Medical History:  Diagnosis Date  . Anxiety   . Bipolar 1 disorder (HCC)   . Schizoaffective disorder (HCC)    History reviewed. No pertinent surgical history. Family History:  Family History  Problem Relation Age of Onset  . Bipolar disorder Mother   . Schizophrenia Father    Family Psychiatric  History: From admission H&P: Patient states mother has been diagnosed with Bipolar Disorder and with Generalized Anxiety Disorder in the past. He has a maternal uncle who is alcoholic. No suicides in family . Of note, states that his mother has reportedly done very well on Tegretol .  Social History:  Social History   Substance and Sexual Activity  Alcohol Use Yes   Comment: daily      Social History   Substance and Sexual Activity  Drug Use Never    Social History   Socioeconomic History  . Marital status: Single   Spouse name: Not on file  . Number of children: Not on file  . Years of education: Not on file  . Highest education level: Not on file  Occupational History  . Not on file  Social Needs  . Financial resource strain: Not on file  . Food insecurity:    Worry: Not on file    Inability: Not on file  . Transportation needs:    Medical: Not on file    Non-medical: Not on file  Tobacco Use  . Smoking status: Current Every Day Smoker    Packs/day: 0.45    Types: Cigarettes  . Smokeless tobacco: Never Used  Substance and Sexual Activity  . Alcohol use: Yes    Comment: daily   . Drug use: Never  . Sexual activity: Not on file  Lifestyle  . Physical activity:    Days per week: Not on file    Minutes per session: Not on file  . Stress: Not on file  Relationships  . Social connections:    Talks on phone: Not on file    Gets together: Not on file    Attends religious service: Not on file    Active member of club or organization: Not on file    Attends meetings of clubs or organizations: Not on file    Relationship status: Not on file  Other Topics Concern  . Not on file  Social History Narrative  . Not on file    Hospital Course: From admission H&P 01/17/2019: 28 year old male, presented to ED voluntarily on 2/6, reporting worsening  depression, feeling hopeless, and having suicidal ideations, with thoughts of jumping in front of a truck. He reports history of intermittent depression since childhood, but states he has been more depressed over the last few weeks, and states he has been facing some significant stressors over recent weeks- homelessness, recent break up, losing  job recently, recent anniversary of prior GF's death , who died by overdose 2 year ago. He has also been drinking daily, usually about 4 beers ( 48 ounces ) per day.  Admission BAL 453, admission UDS (+) for Cannabis. Endorses neuro-vegetative symptoms as below  Mr. Kathlynn GrateSundeen was admitted for suicidal ideation in the  context of alcohol abuse. Ativan CIWA protocol was started for alcohol withdrawal. Tegretol XR was started for mood. Gabapentin was started for anxiety/ETOH withdrawal symptoms. Mr. Kathlynn GrateSundeen [facility]. He stabilized on medication and therapy. He was discharged on the medications listed below. He has shown improvement with improved mood, affect, sleep, appetite, and interaction. He denies any SI/HI/AVH and contracts for safety. He agrees [facility] and [facility] (see below). He is provided with prescriptions and medication samples upon discharge. Patient is educated to avoid alcohol/drug consumption while taking prescription medications, and in particular was advised on the danger of combining Tegretol with alcohol. Patient stated understanding. [facility] family friend and was provided with shelter resources until [facility].   Physical Findings: AIMS: Facial and Oral Movements Muscles of Facial Expression: None, normal Lips and Perioral Area: None, normal Jaw: None, normal Tongue: None, normal,Extremity Movements Upper (arms, wrists, hands, fingers): None, normal Lower (legs, knees, ankles, toes): None, normal, Trunk Movements Neck, shoulders, hips: None, normal, Overall Severity Severity of abnormal movements (highest score from questions above): None, normal Incapacitation due to abnormal movements: None, normal Patient's awareness of abnormal movements (rate only patient's report): No Awareness, Dental Status Current problems with teeth and/or dentures?: No Does patient usually wear dentures?: No  CIWA:  CIWA-Ar Total: 0 COWS:     Musculoskeletal: Strength & Muscle Tone: within normal limits Gait & Station: normal Patient leans: N/A  Psychiatric Specialty Exam: Physical Exam  Nursing note and vitals reviewed. Constitutional: He is oriented to person, place, and time.  He appears well-developed and well-nourished.  Cardiovascular: Normal rate.  Respiratory: Effort normal.  Neurological: He is alert and oriented to person, place, and time.    Review of Systems  Constitutional: Negative.   Psychiatric/Behavioral: Positive for depression (improving) and substance abuse (ETOH). Negative for hallucinations, memory loss and suicidal ideas. The patient is not nervous/anxious and does not have insomnia.     Blood pressure 102/69, pulse 86, temperature (!) 97.4 F (36.3 C), temperature source Oral, resp. rate 16, height 5\' 8"  (1.727 m), weight 65.8 kg, SpO2 100 %.Body mass index is 22.05 kg/m.  See MD's discharge SRA     Have you used any form of tobacco in the last 30 days? (Cigarettes, Smokeless Tobacco, Cigars, and/or Pipes): Yes  Has this patient used any form of tobacco in the last 30 days? (Cigarettes, Smokeless Tobacco, Cigars, and/or Pipes) Yes, a prescription for an FDA-approved medication for tobacco cessation was offered at discharge.   Blood Alcohol level:  Lab Results  Component Value Date   ETH 453 [facility]) 01/16/2019    Metabolic Disorder Labs:  Lab Results  Component Value Date   HGBA1C 5.2 01/18/2019   MPG 102.54 01/18/2019  No results found for: PROLACTIN No results found for: CHOL, TRIG, HDL, CHOLHDL, VLDL, LDLCALC  See Psychiatric Specialty Exam and Suicide Risk Assessment completed by Attending Physician prior to discharge.  Discharge destination:  Home  Is patient on multiple antipsychotic therapies at discharge:  No   Has Patient had three or more failed trials of antipsychotic monotherapy by history:  No  Recommended Plan for Multiple Antipsychotic Therapies: NA  Discharge Instructions    Discharge instructions   Complete by:  As directed    Patient is instructed to take all prescribed medications as recommended. Report any side effects or adverse reactions to your outpatient psychiatrist. Patient is instructed to  abstain from alcohol and illegal drugs while on prescription medications. In the event of worsening symptoms, patient is instructed to call the crisis hotline, 911, or go to the nearest emergency department for evaluation and treatment.     Allergies as of 01/23/2019   No Known Allergies     Medication List    TAKE these medications     Indication  carbamazepine 200 MG 12 hr tablet Commonly known as:  TEGRETOL XR Take 1 tablet (200 mg total) by mouth 2 (two) times daily. For mood  Indication:  Mood   gabapentin 100 MG capsule Commonly known as:  NEURONTIN Take 1 capsule (100 mg total) by mouth 3 (three) times daily. For alcohol withdrawal symptoms  Indication:  Abuse or Misuse of Alcohol   hydrOXYzine 25 MG tablet Commonly known as:  ATARAX/VISTARIL Take 1 tablet (25 mg total) by mouth every 6 (six) hours as needed for anxiety.  Indication:  Feeling Anxious   multivitamin with minerals Tabs tablet Take 1 tablet by mouth daily. (May buy over the counter)  Indication:  Supplementation   nicotine polacrilex 2 MG gum Commonly known as:  NICORETTE Take 1 each (2 mg total) by mouth as needed for smoking cessation.  Indication:  Nicotine Addiction   sodium chloride 0.65 % Soln nasal spray Commonly known as:  OCEAN Place 1 spray into both nostrils as needed for congestion (dry nares). (May buy over the counter)  Indication:  Congestion   traZODone 50 MG tablet Commonly known as:  DESYREL Take 1 tablet (50 mg total) by mouth at bedtime as needed and may repeat dose one time if needed for sleep.  Indication:  Trouble Sleeping      Follow-up Information    [facility] Follow up on 01/29/2019.   Why:  Screening for possible admission on Wed, 2/19 at 7:45AM. Please bring: photo ID/proof of [place] residency, 30 day supply of medications, and clothing. You may also walk in Mon/Tues at 7:45AM for walk-in screening. Thank you.  Contact information: [address] [place] [address] [phone]        [facility] Follow up on 01/27/2019.   Specialty:  Behavioral Health Why:  Hospital follow up appointment is Monday, 2/17 at 9:30a. Please bring photo ID, proof of insurance, current medications and discharge paperwork from this hositalization.  Contact informationElpidio Eric: [address] [place] [address] [phone]           Follow-up recommendations: Activity as tolerated. Diet as recommended by primary care physician. Keep all scheduled follow-up appointments as recommended.   Comments:   Patient is instructed to take all prescribed medications as recommended. Report any side effects or adverse reactions to your outpatient psychiatrist. Patient is instructed to abstain from alcohol and illegal drugs while on prescription medications.  In the event of worsening symptoms, patient is instructed to call the crisis hotline, 911, or go to the nearest emergency department for evaluation and treatment.  Signed: Aldean Baker, NP 01/23/2019, 8:19 AM

## 2019-01-23 NOTE — Progress Notes (Signed)
Patient ID: Brady Mckinney, male   DOB: [birth year], 28 y.o.   MRN: [id number]  Nursing Progress Note [id number]  Data: On initial approach, patient is seen up in the milieu and is interacting with peers. Patient presents with bright, animated affect and is pleasant during interactions. Patient compliant with scheduled medications. Patient denies pain/physical complaints. Patient completed self-inventory sheet and rates depression, hopelessness, and anxiety 0,0,7.23 respectively. Patient rates their sleep and appetite as fair/good respectively. Patient states goal for today is to "complete my discharge plan". Patient is seen attending groups and visible in the milieu. Patient currently denies SI/HI/AVH.   Action: Patient is educated about and provided medication per provider's orders. Patient safety maintained with q15 min safety checks and frequent rounding. Low fall risk precautions in place. Emotional support given. 1:1 interaction and active listening provided. Patient encouraged to attend meals, groups, and work on treatment plan and goals. Labs, vital signs and patient behavior monitored throughout shift.   Response: Patient remains safe on the unit at this time and agrees to come to staff with any issues/concerns. Patient is interacting with peers appropriately on the unit. Will continue to support and monitor.

## 2019-02-17 ENCOUNTER — Other Ambulatory Visit: Payer: Self-pay

## 2019-02-17 ENCOUNTER — Encounter (HOSPITAL_BASED_OUTPATIENT_CLINIC_OR_DEPARTMENT_OTHER): Payer: Self-pay | Admitting: Emergency Medicine

## 2019-02-17 ENCOUNTER — Emergency Department (HOSPITAL_BASED_OUTPATIENT_CLINIC_OR_DEPARTMENT_OTHER)
Admission: EM | Admit: 2019-02-17 | Discharge: 2019-02-17 | Disposition: A | Discharge status: Home / Self Care | Payer: Medicaid Other | Attending: Emergency Medicine | Admitting: Emergency Medicine

## 2019-02-17 DIAGNOSIS — S0993XA Unspecified injury of face, initial encounter: Secondary | ICD-10-CM | POA: Diagnosis present

## 2019-02-17 DIAGNOSIS — R51 Headache: Secondary | ICD-10-CM | POA: Diagnosis not present

## 2019-02-17 DIAGNOSIS — S01112A Laceration without foreign body of left eyelid and periocular area, initial encounter: Secondary | ICD-10-CM

## 2019-02-17 DIAGNOSIS — Y9389 Activity, other specified: Secondary | ICD-10-CM | POA: Diagnosis not present

## 2019-02-17 DIAGNOSIS — F17210 Nicotine dependence, cigarettes, uncomplicated: Secondary | ICD-10-CM | POA: Diagnosis not present

## 2019-02-17 DIAGNOSIS — Z79899 Other long term (current) drug therapy: Secondary | ICD-10-CM | POA: Diagnosis not present

## 2019-02-17 DIAGNOSIS — Y929 Unspecified place or not applicable: Secondary | ICD-10-CM | POA: Insufficient documentation

## 2019-02-17 DIAGNOSIS — Y998 Other external cause status: Secondary | ICD-10-CM | POA: Diagnosis not present

## 2019-02-17 MED ORDER — LIDOCAINE HCL (PF) 1 % IJ SOLN
5.0000 mL | Freq: Once | INTRAMUSCULAR | Status: AC
  Administered 2019-02-17: 5 mL
  Filled 2019-02-17: qty 5

## 2019-02-17 MED ORDER — ACETAMINOPHEN 325 MG PO TABS
650.0000 mg | ORAL_TABLET | Freq: Once | ORAL | Status: AC
  Administered 2019-02-17: 650 mg via ORAL
  Filled 2019-02-17: qty 2
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
## 2019-02-17 NOTE — Discharge Instructions (Addendum)
You were seen in the emergency department today for a laceration. Your laceration was closed with 3 stitches. Please keep this area clean and dry for the next 24-48 hours, after you may get this area wet, but avoid soaking the area. Keep the area covered as best possible especially when in the sun to help in minimizing scarring.   Your tetanus is up to date.  Use tylenol/ibuprofen for pain. You will need to have the stitches removed and the wound rechecked in 3-5 days. Please return to the emergency department, go to an urgent care, or see your primary care provider to have this performed. Return to the ER soon should you start to experience pus type drainage from the wound, redness around the wound, or fevers as this could indicate the area is infected, please return to the ER for any other worsening symptoms or concerns that you may have.
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
## 2019-08-09 ENCOUNTER — Other Ambulatory Visit: Payer: Self-pay

## 2019-08-09 ENCOUNTER — Encounter (HOSPITAL_COMMUNITY): Payer: Self-pay | Admitting: Emergency Medicine

## 2019-08-09 DIAGNOSIS — Y929 Unspecified place or not applicable: Secondary | ICD-10-CM | POA: Insufficient documentation

## 2019-08-09 DIAGNOSIS — S01412A Laceration without foreign body of left cheek and temporomandibular area, initial encounter: Secondary | ICD-10-CM | POA: Diagnosis not present

## 2019-08-09 DIAGNOSIS — Z590 Homelessness: Secondary | ICD-10-CM | POA: Diagnosis not present

## 2019-08-09 DIAGNOSIS — Y999 Unspecified external cause status: Secondary | ICD-10-CM | POA: Insufficient documentation

## 2019-08-09 DIAGNOSIS — F17210 Nicotine dependence, cigarettes, uncomplicated: Secondary | ICD-10-CM | POA: Insufficient documentation

## 2019-08-09 DIAGNOSIS — Y939 Activity, unspecified: Secondary | ICD-10-CM | POA: Insufficient documentation

## 2019-08-09 DIAGNOSIS — Z79899 Other long term (current) drug therapy: Secondary | ICD-10-CM | POA: Diagnosis not present
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
## 2019-08-10 ENCOUNTER — Emergency Department (HOSPITAL_COMMUNITY): Payer: Medicaid Other

## 2019-08-10 ENCOUNTER — Emergency Department (HOSPITAL_COMMUNITY)
Admission: EM | Admit: 2019-08-10 | Discharge: 2019-08-10 | Disposition: A | Discharge status: Home / Self Care | Payer: Medicaid Other | Attending: Emergency Medicine | Admitting: Emergency Medicine

## 2019-08-10 DIAGNOSIS — S0181XA Laceration without foreign body of other part of head, initial encounter: Secondary | ICD-10-CM

## 2019-08-10 MED ORDER — HYDROCODONE-ACETAMINOPHEN 5-325 MG PO TABS
1.0000 | ORAL_TABLET | Freq: Once | ORAL | Status: AC
  Administered 2019-08-10: 1 via ORAL
  Filled 2019-08-10: qty 1

## 2019-08-10 MED ORDER — LIDOCAINE HCL 1 % IJ SOLN
INTRAMUSCULAR | Status: AC
  Filled 2019-08-10: qty 20

## 2019-08-10 MED ORDER — IBUPROFEN 600 MG PO TABS: 600.0000 mg | ORAL_TABLET | Freq: Four times a day (QID) | ORAL | 0 refills | Status: AC | PRN
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
## 2020-01-23 ENCOUNTER — Encounter (HOSPITAL_BASED_OUTPATIENT_CLINIC_OR_DEPARTMENT_OTHER): Payer: Self-pay

## 2020-01-23 ENCOUNTER — Emergency Department (HOSPITAL_BASED_OUTPATIENT_CLINIC_OR_DEPARTMENT_OTHER)
Admission: EM | Admit: 2020-01-23 | Discharge: 2020-01-23 | Disposition: A | Discharge status: Home / Self Care | Payer: Medicaid Other | Attending: Emergency Medicine | Admitting: Emergency Medicine

## 2020-01-23 ENCOUNTER — Other Ambulatory Visit: Payer: Self-pay

## 2020-01-23 DIAGNOSIS — F17210 Nicotine dependence, cigarettes, uncomplicated: Secondary | ICD-10-CM | POA: Insufficient documentation

## 2020-01-23 DIAGNOSIS — H9201 Otalgia, right ear: Secondary | ICD-10-CM | POA: Diagnosis present

## 2020-01-23 DIAGNOSIS — Z79899 Other long term (current) drug therapy: Secondary | ICD-10-CM | POA: Insufficient documentation

## 2020-01-23 HISTORY — DX: Alcohol abuse, uncomplicated: F10.10

## 2020-01-23 MED ORDER — OFLOXACIN 0.3 % OT SOLN: 10.0000 [drp] | Freq: Every day | OTIC | 0 refills | Status: DC

## 2020-01-23 MED ORDER — AMOXICILLIN 875 MG PO TABS: 875.0000 mg | ORAL_TABLET | Freq: Two times a day (BID) | ORAL | 0 refills | Status: AC

## 2020-01-23 MED ORDER — AMOXICILLIN 875 MG PO TABS: 875.0000 mg | ORAL_TABLET | Freq: Two times a day (BID) | ORAL | 0 refills | Status: DC

## 2020-01-23 MED ORDER — OFLOXACIN 0.3 % OT SOLN: 10.0000 [drp] | Freq: Every day | OTIC | 0 refills | Status: AC

## 2020-01-23 MED FILL — AMOXICILLIN 875 MG TABS: 875 | 5 days supply | Qty: 10 | Fill #0

## 2020-01-23 MED FILL — OFLOXACIN 0.3 % SOLN: 0.3 | 10 days supply | Qty: 5 | Fill #0

## 2020-01-23 NOTE — ED Provider Notes (Signed)
[facility] EMERGENCY DEPARTMENT Provider Note   CSN: [id number] Arrival date & time: 01/23/20  1314     History Chief Complaint  Patient presents with  . Otalgia    Brady Mckinney is a 29 y.o. male.  HPI 29 year old Caucasian male with a past medical history significant for bipolar disorder, schizoaffective disorder, EtOH abuse presents to the emergency department today for 4-day history of right ear pain.  Patient reports pain with manipulation of his right ear.  Also reports decreased hearing.  Describes as a throbbing sensation.  No fevers, chills, nausea or vomiting.  No diarrhea.  No nasal congestion or sore throat.  Has taken no medications for pain prior to arrival.  Patient states that he is currently in [facility] for alcohol detox.    Past Medical History:  Diagnosis Date  . Anxiety   . Bipolar 1 disorder ([place])   . ETOH abuse   . Schizoaffective disorder [facility])     Patient Active Problem List   Diagnosis Date Noted  . Alcohol abuse 01/20/2019  . MDD (major depressive disorder), severe ([place]) 01/17/2019    History reviewed. No pertinent surgical history.     Family History  Problem Relation Age of Onset  . Bipolar disorder Mother   . Schizophrenia Father     Social History   Tobacco Use  . Smoking status: Current Every Day Smoker    Packs/day: 0.45    Types: Cigarettes  . Smokeless tobacco: Never Used  Substance Use Topics  . Alcohol use: Yes    Comment:  in rehab  . Drug use: Never    Home Medications Prior to Admission medications   Medication Sig Start Date End Date Taking? Authorizing Provider  amoxicillin (AMOXIL) 875 MG tablet Take 1 tablet (875 mg total) by mouth 2 (two) times daily. 01/23/20   Doristine Devoid, PA-C  carbamazepine (TEGRETOL) 100 MG chewable tablet Chew 100 mg by mouth 2 (two) times daily. 07/14/19   [provider]  gabapentin (NEURONTIN) 300 MG capsule Take 300 mg by mouth 3 (three) times daily. 07/14/19    [provider]  hydrOXYzine (ATARAX/VISTARIL) 50 MG tablet Take 50 mg by mouth 3 (three) times daily as needed for anxiety or itching.  07/14/19   [provider]  ibuprofen (ADVIL) 600 MG tablet Take 1 tablet (600 mg total) by mouth every 6 (six) hours as needed. 08/10/19   Domenic Moras, PA-C  Multiple Vitamin (MULTIVITAMIN WITH MINERALS) TABS tablet Take 1 tablet by mouth daily. (May buy over the counter) 01/23/19   Connye Burkitt, NP  ofloxacin (FLOXIN) 0.3 % OTIC solution Place 10 drops into the right ear daily. 01/23/20   Doristine Devoid, PA-C  traZODone (DESYREL) 100 MG tablet Take 100 mg by mouth at bedtime. 07/14/19   [provider]  sodium chloride (OCEAN) 0.65 % SOLN nasal spray Place 1 spray into both nostrils as needed for congestion (dry nares). (May buy over the counter) Patient not taking: Reported on 08/10/2019 01/23/19 08/10/19  Connye Burkitt, NP    Allergies    Patient has no known allergies.  Review of Systems   Review of Systems  Constitutional: Negative for chills and fever.  HENT: Positive for ear pain. Negative for congestion, ear discharge and sore throat.   Eyes: Negative for discharge.  Respiratory: Negative for cough.   Gastrointestinal: Negative for nausea and vomiting.  Neurological: Negative for headaches.  Psychiatric/Behavioral: Negative for confusion.  Physical Exam Updated Vital Signs BP 130/78 (BP Location: Left Arm)   Pulse 89   Temp 98.9 F (37.2 C) (Oral)   Resp 18   Ht 5\' 8"  (1.727 m)   Wt 81.6 kg   SpO2 100%   BMI 27.37 kg/m   Physical Exam Vitals and nursing note reviewed.  Constitutional:      General: He is not in acute distress.    Appearance: He is well-developed.  HENT:     Head: Normocephalic and atraumatic.     Ears:     Comments: Cerumen impaction to left ear.  Right ear canal is edematous with purulent drainage.  Patient does have debris noted.  Partial evaluation of the TM that does not appear  perforated however unable to visualize the entire TM secondary to canal edema.  Patient does have pain with manipulation of the auricle and tragus.  There is no mastoid tenderness or erythema behind the right ear.    Nose: Nose normal.  Eyes:     General: No scleral icterus.       Right eye: No discharge.        Left eye: No discharge.  Pulmonary:     Effort: No respiratory distress.  Musculoskeletal:        General: Normal range of motion.     Cervical back: Normal range of motion.  Skin:    Coloration: Skin is not pale.  Neurological:     Mental Status: He is alert.  Psychiatric:        Behavior: Behavior normal.        Thought Content: Thought content normal.        Judgment: Judgment normal.     ED Results / Procedures / Treatments   Labs (all labs ordered are listed, but only abnormal results are displayed) Labs Reviewed - No data to display  EKG None  Radiology No results found.  Procedures Procedures (including critical care time)  Medications Ordered in ED Medications - No data to display  ED Course  I have reviewed the triage vital signs and the nursing notes.  Pertinent labs & imaging results that were available during my care of the patient were reviewed by me and considered in my medical decision making (see chart for details).    MDM Rules/Calculators/A&P                      29 year old male presents the ER for ear pain.  On exam patient has concerning signs for otitis externa.  Unable to completely visualize the TM.  Given the purulent drainage in the ear canal consider TM rupture.  Patient will placed on amoxicillin along with ofloxacin drops.  Patient encouraged to you take Motrin and Tylenol at home for pain.  There is no signs of mastoiditis on exam.  Will give ENT follow-up.  Pt is hemodynamically stable, in NAD, & able to ambulate in the ED. Evaluation does not show pathology that would require ongoing emergent intervention or inpatient  treatment. I explained the diagnosis to the patient. Pain has been managed & has no complaints prior to dc. Pt is comfortable with above plan and is stable for discharge at this time. All questions were answered prior to disposition. Strict return precautions for f/u to the ED were discussed. Encouraged follow up with PCP.  Final Clinical Impression(s) / ED Diagnoses Final diagnoses:  Right ear pain    Rx / DC Orders ED Discharge Orders  Ordered    amoxicillin (AMOXIL) 875 MG tablet  2 times daily,   Status:  Discontinued     01/23/20 1352    ofloxacin (FLOXIN) 0.3 % OTIC solution  Daily,   Status:  Discontinued     01/23/20 1352    amoxicillin (AMOXIL) 875 MG tablet  2 times daily     01/23/20 1357    ofloxacin (FLOXIN) 0.3 % OTIC solution  Daily     01/23/20 1357           Wallace Keller 01/23/20 1601    Linwood Dibbles, MD 01/24/20 Barry Brunner

## 2020-01-23 NOTE — Discharge Instructions (Signed)
You do have signs of an outer and inner ear infection.  Take the oral antibiotics for the next 5 days.  Also use the drops as prescribed.  Motrin Tylenol for pain.  Not to take any foreign body into the ear itself.  Have given you ear nose and throat follow-up if symptoms not improving.  If worsen return to the ER.

## 2020-01-23 NOTE — ED Triage Notes (Signed)
Pt c/o right earache x 3 days-pt at [facility] since 1/24 for ETOH abuse-NAD-steady gait

## 2020-05-06 ENCOUNTER — Other Ambulatory Visit: Payer: Self-pay

## 2020-05-06 ENCOUNTER — Encounter (HOSPITAL_COMMUNITY): Payer: Self-pay

## 2020-05-06 DIAGNOSIS — F17210 Nicotine dependence, cigarettes, uncomplicated: Secondary | ICD-10-CM | POA: Diagnosis present

## 2020-05-06 DIAGNOSIS — E876 Hypokalemia: Secondary | ICD-10-CM | POA: Diagnosis present

## 2020-05-06 DIAGNOSIS — K760 Fatty (change of) liver, not elsewhere classified: Secondary | ICD-10-CM | POA: Diagnosis present

## 2020-05-06 DIAGNOSIS — Z818 Family history of other mental and behavioral disorders: Secondary | ICD-10-CM

## 2020-05-06 DIAGNOSIS — K5130 Ulcerative (chronic) rectosigmoiditis without complications: Principal | ICD-10-CM | POA: Diagnosis present

## 2020-05-06 DIAGNOSIS — F319 Bipolar disorder, unspecified: Secondary | ICD-10-CM | POA: Diagnosis present

## 2020-05-06 DIAGNOSIS — Z791 Long term (current) use of non-steroidal anti-inflammatories (NSAID): Secondary | ICD-10-CM

## 2020-05-06 DIAGNOSIS — F259 Schizoaffective disorder, unspecified: Secondary | ICD-10-CM | POA: Diagnosis present

## 2020-05-06 DIAGNOSIS — F1290 Cannabis use, unspecified, uncomplicated: Secondary | ICD-10-CM | POA: Diagnosis present

## 2020-05-06 DIAGNOSIS — Z20822 Contact with and (suspected) exposure to covid-19: Secondary | ICD-10-CM | POA: Diagnosis present

## 2020-05-06 DIAGNOSIS — A09 Infectious gastroenteritis and colitis, unspecified: Secondary | ICD-10-CM | POA: Diagnosis present

## 2020-05-06 DIAGNOSIS — Z79899 Other long term (current) drug therapy: Secondary | ICD-10-CM

## 2020-05-06 DIAGNOSIS — R309 Painful micturition, unspecified: Secondary | ICD-10-CM | POA: Diagnosis present

## 2020-05-06 DIAGNOSIS — R809 Proteinuria, unspecified: Secondary | ICD-10-CM | POA: Diagnosis present

## 2020-05-06 DIAGNOSIS — F10239 Alcohol dependence with withdrawal, unspecified: Secondary | ICD-10-CM | POA: Diagnosis present

## 2020-05-06 DIAGNOSIS — E878 Other disorders of electrolyte and fluid balance, not elsewhere classified: Secondary | ICD-10-CM | POA: Diagnosis present

## 2020-05-06 DIAGNOSIS — R824 Acetonuria: Secondary | ICD-10-CM | POA: Diagnosis present

## 2020-05-06 DIAGNOSIS — F1010 Alcohol abuse, uncomplicated: Secondary | ICD-10-CM | POA: Diagnosis present

## 2020-05-06 DIAGNOSIS — R519 Headache, unspecified: Secondary | ICD-10-CM | POA: Diagnosis present

## 2020-05-06 DIAGNOSIS — R945 Abnormal results of liver function studies: Secondary | ICD-10-CM | POA: Diagnosis present

## 2020-05-06 DIAGNOSIS — Z5329 Procedure and treatment not carried out because of patient's decision for other reasons: Secondary | ICD-10-CM | POA: Diagnosis present

## 2020-05-06 DIAGNOSIS — F419 Anxiety disorder, unspecified: Secondary | ICD-10-CM | POA: Diagnosis present

## 2020-05-06 DIAGNOSIS — R000 Tachycardia, unspecified: Secondary | ICD-10-CM | POA: Diagnosis present

## 2020-05-06 LAB — CBC
HCT: 48.3 % (ref 39.0–52.0)
Hemoglobin: 16.5 g/dL (ref 13.0–17.0)
MCH: 30.9 pg (ref 26.0–34.0)
MCHC: 34.2 g/dL (ref 30.0–36.0)
MCV: 90.4 fL (ref 80.0–100.0)
Platelets: 219 10*3/uL (ref 150–400)
RBC: 5.34 MIL/uL (ref 4.22–5.81)
RDW: 13.4 % (ref 11.5–15.5)
WBC: 6.3 10*3/uL (ref 4.0–10.5)
nRBC: 0 % (ref 0.0–0.2)

## 2020-05-06 LAB — COMPREHENSIVE METABOLIC PANEL
ALT: 239 U/L — ABNORMAL HIGH (ref 0–44)
AST: 294 U/L — ABNORMAL HIGH (ref 15–41)
Albumin: 4.6 g/dL (ref 3.5–5.0)
Alkaline Phosphatase: 153 U/L — ABNORMAL HIGH (ref 38–126)
Anion gap: 14 (ref 5–15)
BUN: <5 mg/dL — ABNORMAL LOW (ref 6–20)
CO2: 27 mmol/L (ref 22–32)
Calcium: 9.2 mg/dL (ref 8.9–10.3)
Chloride: 94 mmol/L — ABNORMAL LOW (ref 98–111)
Creatinine, Ser: 0.76 mg/dL (ref 0.61–1.24)
GFR calc Af Amer: >60 mL/min (ref >60)
GFR calc non Af Amer: >60 mL/min (ref >60)
Glucose, Bld: 137 mg/dL — ABNORMAL HIGH (ref 70–99)
Potassium: 3.1 mmol/L — ABNORMAL LOW (ref 3.5–5.1)
Sodium: 135 mmol/L (ref 135–145)
Total Bilirubin: 1.4 mg/dL — ABNORMAL HIGH (ref 0.3–1.2)
Total Protein: 7.7 g/dL (ref 6.5–8.1)

## 2020-05-06 LAB — URINALYSIS, ROUTINE W REFLEX MICROSCOPIC
Bilirubin Urine: NEGATIVE
Glucose, UA: NEGATIVE mg/dL
Hgb urine dipstick: NEGATIVE
Ketones, ur: 5 mg/dL — AB
Leukocytes,Ua: NEGATIVE
Nitrite: NEGATIVE
Protein, ur: 100 mg/dL — AB
Specific Gravity, Urine: 1.018 (ref 1.005–1.030)
pH: 6 (ref 5.0–8.0)

## 2020-05-06 LAB — LIPASE, BLOOD: Lipase: 36 U/L (ref 11–51)

## 2020-05-06 MED ORDER — SODIUM CHLORIDE 0.9% FLUSH: 3.0000 mL | Freq: Once | INTRAVENOUS | Status: DC

## 2020-05-06 NOTE — ED Triage Notes (Signed)
Pt reports vomiting, abdominal pain and weakness x2 weeks. States that he cannot keep anything down. A&Ox4.

## 2020-05-07 ENCOUNTER — Inpatient Hospital Stay (HOSPITAL_COMMUNITY)
Admission: EM | Admit: 2020-05-07 | Discharge: 2020-05-09 | DRG: 386 | Discharge status: Against Medical Advice | Payer: Medicaid Other | Attending: Family Medicine | Admitting: Family Medicine

## 2020-05-07 ENCOUNTER — Emergency Department (HOSPITAL_COMMUNITY): Payer: Medicaid Other

## 2020-05-07 DIAGNOSIS — K529 Noninfective gastroenteritis and colitis, unspecified: Secondary | ICD-10-CM | POA: Diagnosis not present

## 2020-05-07 DIAGNOSIS — Z20822 Contact with and (suspected) exposure to covid-19: Secondary | ICD-10-CM | POA: Diagnosis present

## 2020-05-07 DIAGNOSIS — E878 Other disorders of electrolyte and fluid balance, not elsewhere classified: Secondary | ICD-10-CM | POA: Diagnosis present

## 2020-05-07 DIAGNOSIS — K5130 Ulcerative (chronic) rectosigmoiditis without complications: Secondary | ICD-10-CM | POA: Diagnosis not present

## 2020-05-07 DIAGNOSIS — R000 Tachycardia, unspecified: Secondary | ICD-10-CM | POA: Diagnosis present

## 2020-05-07 DIAGNOSIS — F319 Bipolar disorder, unspecified: Secondary | ICD-10-CM | POA: Diagnosis present

## 2020-05-07 DIAGNOSIS — F1010 Alcohol abuse, uncomplicated: Secondary | ICD-10-CM | POA: Diagnosis present

## 2020-05-07 DIAGNOSIS — R809 Proteinuria, unspecified: Secondary | ICD-10-CM | POA: Diagnosis present

## 2020-05-07 DIAGNOSIS — Z818 Family history of other mental and behavioral disorders: Secondary | ICD-10-CM | POA: Diagnosis not present

## 2020-05-07 DIAGNOSIS — R309 Painful micturition, unspecified: Secondary | ICD-10-CM | POA: Diagnosis present

## 2020-05-07 DIAGNOSIS — A09 Infectious gastroenteritis and colitis, unspecified: Secondary | ICD-10-CM | POA: Diagnosis present

## 2020-05-07 DIAGNOSIS — E876 Hypokalemia: Secondary | ICD-10-CM

## 2020-05-07 DIAGNOSIS — R824 Acetonuria: Secondary | ICD-10-CM | POA: Diagnosis present

## 2020-05-07 DIAGNOSIS — F10239 Alcohol dependence with withdrawal, unspecified: Secondary | ICD-10-CM | POA: Diagnosis present

## 2020-05-07 DIAGNOSIS — F259 Schizoaffective disorder, unspecified: Secondary | ICD-10-CM

## 2020-05-07 DIAGNOSIS — F1290 Cannabis use, unspecified, uncomplicated: Secondary | ICD-10-CM | POA: Diagnosis present

## 2020-05-07 DIAGNOSIS — R519 Headache, unspecified: Secondary | ICD-10-CM | POA: Diagnosis present

## 2020-05-07 DIAGNOSIS — Z79899 Other long term (current) drug therapy: Secondary | ICD-10-CM | POA: Diagnosis not present

## 2020-05-07 DIAGNOSIS — R7401 Elevation of levels of liver transaminase levels: Secondary | ICD-10-CM | POA: Diagnosis not present

## 2020-05-07 DIAGNOSIS — Z791 Long term (current) use of non-steroidal anti-inflammatories (NSAID): Secondary | ICD-10-CM | POA: Diagnosis not present

## 2020-05-07 DIAGNOSIS — K760 Fatty (change of) liver, not elsewhere classified: Secondary | ICD-10-CM | POA: Diagnosis present

## 2020-05-07 DIAGNOSIS — R109 Unspecified abdominal pain: Secondary | ICD-10-CM | POA: Diagnosis present

## 2020-05-07 DIAGNOSIS — Z5329 Procedure and treatment not carried out because of patient's decision for other reasons: Secondary | ICD-10-CM | POA: Diagnosis present

## 2020-05-07 DIAGNOSIS — F419 Anxiety disorder, unspecified: Secondary | ICD-10-CM | POA: Diagnosis present

## 2020-05-07 DIAGNOSIS — F17210 Nicotine dependence, cigarettes, uncomplicated: Secondary | ICD-10-CM | POA: Diagnosis present

## 2020-05-07 DIAGNOSIS — R945 Abnormal results of liver function studies: Secondary | ICD-10-CM | POA: Diagnosis present

## 2020-05-07 DIAGNOSIS — R7989 Other specified abnormal findings of blood chemistry: Secondary | ICD-10-CM

## 2020-05-07 LAB — HEPATITIS PANEL, ACUTE
HCV Ab: NONREACTIVE
Hep A IgM: NONREACTIVE
Hep B C IgM: NONREACTIVE
Hepatitis B Surface Ag: NONREACTIVE

## 2020-05-07 LAB — RAPID URINE DRUG SCREEN, HOSP PERFORMED
Amphetamines: NOT DETECTED
Barbiturates: NOT DETECTED
Benzodiazepines: NOT DETECTED
Cocaine: NOT DETECTED
Opiates: POSITIVE — AB
Tetrahydrocannabinol: POSITIVE — AB

## 2020-05-07 LAB — COMPREHENSIVE METABOLIC PANEL
ALT: 183 U/L — ABNORMAL HIGH (ref 0–44)
AST: 221 U/L — ABNORMAL HIGH (ref 15–41)
Albumin: 3.6 g/dL (ref 3.5–5.0)
Alkaline Phosphatase: 120 U/L (ref 38–126)
Anion gap: 10 (ref 5–15)
BUN: <5 mg/dL — ABNORMAL LOW (ref 6–20)
CO2: 27 mmol/L (ref 22–32)
Calcium: 7.9 mg/dL — ABNORMAL LOW (ref 8.9–10.3)
Chloride: 98 mmol/L (ref 98–111)
Creatinine, Ser: 0.72 mg/dL (ref 0.61–1.24)
GFR calc Af Amer: >60 mL/min (ref >60)
GFR calc non Af Amer: >60 mL/min (ref >60)
Glucose, Bld: 109 mg/dL — ABNORMAL HIGH (ref 70–99)
Potassium: 3.4 mmol/L — ABNORMAL LOW (ref 3.5–5.1)
Sodium: 135 mmol/L (ref 135–145)
Total Bilirubin: 1.2 mg/dL (ref 0.3–1.2)
Total Protein: 6.1 g/dL — ABNORMAL LOW (ref 6.5–8.1)

## 2020-05-07 LAB — PHOSPHORUS: Phosphorus: 3.6 mg/dL (ref 2.5–4.6)

## 2020-05-07 LAB — C DIFFICILE QUICK SCREEN W PCR REFLEX
C Diff antigen: NEGATIVE
C Diff interpretation: NOT DETECTED
C Diff toxin: NEGATIVE

## 2020-05-07 LAB — CBC
HCT: 42.3 % (ref 39.0–52.0)
Hemoglobin: 14.1 g/dL (ref 13.0–17.0)
MCH: 30.5 pg (ref 26.0–34.0)
MCHC: 33.3 g/dL (ref 30.0–36.0)
MCV: 91.6 fL (ref 80.0–100.0)
Platelets: 171 10*3/uL (ref 150–400)
RBC: 4.62 MIL/uL (ref 4.22–5.81)
RDW: 13.7 % (ref 11.5–15.5)
WBC: 4.6 10*3/uL (ref 4.0–10.5)
nRBC: 0 % (ref 0.0–0.2)

## 2020-05-07 LAB — MAGNESIUM: Magnesium: 1.9 mg/dL (ref 1.7–2.4)

## 2020-05-07 LAB — SARS CORONAVIRUS 2 BY RT PCR (HOSPITAL ORDER, PERFORMED IN ~~LOC~~ HOSPITAL LAB): SARS Coronavirus 2: NEGATIVE

## 2020-05-07 MED ORDER — MORPHINE SULFATE (PF) 4 MG/ML IV SOLN
4.0000 mg | Freq: Once | INTRAVENOUS | Status: AC
  Administered 2020-05-07: 4 mg via INTRAVENOUS
  Filled 2020-05-07: qty 1

## 2020-05-07 MED ORDER — ORAL CARE MOUTH RINSE
15.0000 mL | Freq: Two times a day (BID) | OROMUCOSAL | Status: DC
  Administered 2020-05-07 – 2020-05-09 (×4): 15 mL via OROMUCOSAL

## 2020-05-07 MED ORDER — METRONIDAZOLE IN NACL 5-0.79 MG/ML-% IV SOLN
500.0000 mg | Freq: Three times a day (TID) | INTRAVENOUS | Status: DC
  Administered 2020-05-07 – 2020-05-09 (×6): 500 mg via INTRAVENOUS
  Filled 2020-05-07 (×6): qty 100

## 2020-05-07 MED ORDER — POTASSIUM CHLORIDE 2 MEQ/ML IV SOLN
INTRAVENOUS | Status: DC
  Filled 2020-05-07 (×6): qty 1000

## 2020-05-07 MED ORDER — HYDROMORPHONE HCL 1 MG/ML IJ SOLN
1.0000 mg | Freq: Once | INTRAMUSCULAR | Status: AC
  Administered 2020-05-07: 1 mg via INTRAVENOUS
  Filled 2020-05-07: qty 1

## 2020-05-07 MED ORDER — CARBAMAZEPINE 100 MG PO CHEW
100.0000 mg | CHEWABLE_TABLET | Freq: Two times a day (BID) | ORAL | Status: DC
  Administered 2020-05-07 – 2020-05-09 (×4): 100 mg via ORAL
  Filled 2020-05-07 (×6): qty 1

## 2020-05-07 MED ORDER — METRONIDAZOLE IN NACL 5-0.79 MG/ML-% IV SOLN
500.0000 mg | Freq: Once | INTRAVENOUS | Status: AC
  Administered 2020-05-07: 500 mg via INTRAVENOUS
  Filled 2020-05-07: qty 100

## 2020-05-07 MED ORDER — LORAZEPAM 1 MG PO TABS: 1.0000 mg | ORAL_TABLET | ORAL | Status: DC | PRN

## 2020-05-07 MED ORDER — SODIUM CHLORIDE 0.9 % IV SOLN: INTRAVENOUS | Status: DC

## 2020-05-07 MED ORDER — POTASSIUM CHLORIDE 10 MEQ/100ML IV SOLN
10.0000 meq | Freq: Once | INTRAVENOUS | Status: AC
  Administered 2020-05-07: 10 meq via INTRAVENOUS
  Filled 2020-05-07: qty 100

## 2020-05-07 MED ORDER — PANTOPRAZOLE SODIUM 40 MG IV SOLR
40.0000 mg | Freq: Two times a day (BID) | INTRAVENOUS | Status: DC
  Administered 2020-05-07 – 2020-05-08 (×2): 40 mg via INTRAVENOUS
  Filled 2020-05-07 (×2): qty 40

## 2020-05-07 MED ORDER — MORPHINE SULFATE (PF) 2 MG/ML IV SOLN
2.0000 mg | INTRAVENOUS | Status: DC | PRN
  Administered 2020-05-07 – 2020-05-09 (×4): 2 mg via INTRAVENOUS
  Filled 2020-05-07 (×4): qty 1

## 2020-05-07 MED ORDER — HYDROXYZINE HCL 25 MG PO TABS
25.0000 mg | ORAL_TABLET | Freq: Three times a day (TID) | ORAL | Status: DC | PRN
  Administered 2020-05-07 – 2020-05-08 (×3): 25 mg via ORAL
  Filled 2020-05-07 (×3): qty 1

## 2020-05-07 MED ORDER — THIAMINE HCL 100 MG PO TABS
100.0000 mg | ORAL_TABLET | Freq: Every day | ORAL | Status: DC
  Administered 2020-05-07 – 2020-05-09 (×3): 100 mg via ORAL
  Filled 2020-05-07 (×3): qty 1

## 2020-05-07 MED ORDER — ONDANSETRON HCL 4 MG/2ML IJ SOLN
4.0000 mg | Freq: Once | INTRAMUSCULAR | Status: AC
  Administered 2020-05-07: 4 mg via INTRAVENOUS
  Filled 2020-05-07: qty 2

## 2020-05-07 MED ORDER — GABAPENTIN 300 MG PO CAPS
300.0000 mg | ORAL_CAPSULE | Freq: Three times a day (TID) | ORAL | Status: DC
  Administered 2020-05-07 – 2020-05-09 (×6): 300 mg via ORAL
  Filled 2020-05-07 (×6): qty 1

## 2020-05-07 MED ORDER — PROMETHAZINE HCL 25 MG/ML IJ SOLN: 12.5000 mg | Freq: Four times a day (QID) | INTRAMUSCULAR | Status: DC | PRN

## 2020-05-07 MED ORDER — FOLIC ACID 1 MG PO TABS
1.0000 mg | ORAL_TABLET | Freq: Every day | ORAL | Status: DC
  Administered 2020-05-07 – 2020-05-09 (×3): 1 mg via ORAL
  Filled 2020-05-07 (×3): qty 1

## 2020-05-07 MED ORDER — SODIUM CHLORIDE (PF) 0.9 % IJ SOLN
INTRAMUSCULAR | Status: AC
  Filled 2020-05-07: qty 50

## 2020-05-07 MED ORDER — CIPROFLOXACIN IN D5W 400 MG/200ML IV SOLN
400.0000 mg | Freq: Two times a day (BID) | INTRAVENOUS | Status: DC
  Administered 2020-05-07 – 2020-05-09 (×5): 400 mg via INTRAVENOUS
  Filled 2020-05-07 (×4): qty 200

## 2020-05-07 MED ORDER — THIAMINE HCL 100 MG/ML IJ SOLN: 100.0000 mg | Freq: Every day | INTRAMUSCULAR | Status: DC

## 2020-05-07 MED ORDER — FAMOTIDINE IN NACL 20-0.9 MG/50ML-% IV SOLN
20.0000 mg | Freq: Two times a day (BID) | INTRAVENOUS | Status: DC
  Administered 2020-05-07: 20 mg via INTRAVENOUS
  Filled 2020-05-07: qty 50

## 2020-05-07 MED ORDER — IOHEXOL 300 MG/ML  SOLN
100.0000 mL | Freq: Once | INTRAMUSCULAR | Status: AC | PRN
  Administered 2020-05-07: 100 mL via INTRAVENOUS

## 2020-05-07 MED ORDER — POTASSIUM CHLORIDE CRYS ER 20 MEQ PO TBCR
40.0000 meq | EXTENDED_RELEASE_TABLET | Freq: Once | ORAL | Status: AC
  Administered 2020-05-07: 40 meq via ORAL
  Filled 2020-05-07: qty 2

## 2020-05-07 MED ORDER — LORAZEPAM 2 MG/ML IJ SOLN
1.0000 mg | INTRAMUSCULAR | Status: DC | PRN
  Administered 2020-05-07: 1 mg via INTRAVENOUS
  Administered 2020-05-08: 2 mg via INTRAVENOUS
  Filled 2020-05-07 (×3): qty 1

## 2020-05-07 MED ORDER — ONDANSETRON HCL 4 MG/2ML IJ SOLN
4.0000 mg | Freq: Four times a day (QID) | INTRAMUSCULAR | Status: DC | PRN
  Administered 2020-05-07: 4 mg via INTRAVENOUS
  Filled 2020-05-07: qty 2

## 2020-05-07 MED ORDER — TRAZODONE HCL 100 MG PO TABS
100.0000 mg | ORAL_TABLET | Freq: Every day | ORAL | Status: DC
  Administered 2020-05-07 – 2020-05-08 (×2): 100 mg via ORAL
  Filled 2020-05-07 (×2): qty 1

## 2020-05-07 MED ORDER — NICOTINE 14 MG/24HR TD PT24
14.0000 mg | MEDICATED_PATCH | Freq: Every day | TRANSDERMAL | Status: DC
  Administered 2020-05-07 – 2020-05-09 (×3): 14 mg via TRANSDERMAL
  Filled 2020-05-07 (×3): qty 1

## 2020-05-07 MED ORDER — SODIUM CHLORIDE 0.9 % IV BOLUS
1000.0000 mL | Freq: Once | INTRAVENOUS | Status: AC
  Administered 2020-05-07: 1000 mL via INTRAVENOUS

## 2020-05-07 MED ORDER — ENOXAPARIN SODIUM 40 MG/0.4ML ~~LOC~~ SOLN
40.0000 mg | SUBCUTANEOUS | Status: DC
  Administered 2020-05-07 – 2020-05-09 (×3): 40 mg via SUBCUTANEOUS
  Filled 2020-05-07 (×3): qty 0.4

## 2020-05-07 MED ORDER — CIPROFLOXACIN IN D5W 400 MG/200ML IV SOLN
400.0000 mg | Freq: Once | INTRAVENOUS | Status: DC
  Filled 2020-05-07: qty 200

## 2020-05-07 MED ORDER — ADULT MULTIVITAMIN W/MINERALS CH
1.0000 | ORAL_TABLET | Freq: Every day | ORAL | Status: DC
  Administered 2020-05-07 – 2020-05-09 (×3): 1 via ORAL
  Filled 2020-05-07 (×3): qty 1

## 2020-05-07 MED ORDER — CHLORHEXIDINE GLUCONATE 0.12 % MT SOLN
15.0000 mL | Freq: Two times a day (BID) | OROMUCOSAL | Status: DC
  Administered 2020-05-07 – 2020-05-09 (×4): 15 mL via OROMUCOSAL
  Filled 2020-05-07 (×4): qty 15

## 2020-05-07 NOTE — Consult Note (Signed)
Referring Provider: Dr. Guilford Shi Primary Care Physician:  Dustin Flock, PA-C Primary Gastroenterologist:  Althia Forts  Reason for Consultation:  Nausea/vomiting, diarrhea, abdominal pain  HPI: Brady Mckinney is a 29 y.o. male with history of bipolar 1 disorder presenting with nausea/vomiting, diarrhea, and abdominal pain.  Patient reports nausea, vomiting, and diarrhea for the past 2 weeks.  He has had 10-20 episodes of emesis daily and felt dehydrated which caused him to present to the ED.  He has seen a small amount of red blood in the emesis.  He is also been having diffuse abdominal pain which is worse with food intake, as well as nonbloody diarrhea.  He states he has had chronic diarrhea for the past 2 years and occasionally has red blood mixed in the diarrhea and associated rectal discomfort.  Denies any dysphagia, heartburn, fever, melena, or current hematochezia.  Has not been able to eat much over the last few weeks due to nausea and vomiting.  Denies recent exposure to antibiotics.  Denies recent travel.  Denies marijuana use.  He has a history of heavy alcohol use and was admitted in January for alcohol withdrawal.  He has not been drinking heavily since then and drinks approximately 2 beers per week.  Denies frequent or regular NSAID use.  Only takes ibuprofen on the rare occasion that he has a headache.  He has never had any work-up for chronic diarrhea.  Denies any family history of inflammatory bowel disease or colon cancer.  Past Medical History:  Diagnosis Date  . Anxiety   . Bipolar 1 disorder ([place])   . ETOH abuse   . Schizoaffective disorder ([place])     History reviewed. No pertinent surgical history.  Prior to Admission medications   Medication Sig Start Date End Date Taking? Authorizing Provider  amoxicillin (AMOXIL) 875 MG tablet Take 1 tablet (875 mg total) by mouth 2 (two) times daily. 01/23/20  Yes Doristine Devoid, PA-C  carbamazepine (TEGRETOL) 100 MG  chewable tablet Chew 100 mg by mouth 2 (two) times daily. 07/14/19  Yes [provider]  gabapentin (NEURONTIN) 300 MG capsule Take 300 mg by mouth 3 (three) times daily. 07/14/19  Yes [provider]  hydrOXYzine (ATARAX/VISTARIL) 50 MG tablet Take 50 mg by mouth 3 (three) times daily as needed for anxiety or itching.  07/14/19  Yes [provider]  ibuprofen (ADVIL) 600 MG tablet Take 1 tablet (600 mg total) by mouth every 6 (six) hours as needed. Patient taking differently: Take 600 mg by mouth every 6 (six) hours as needed for headache.  08/10/19  Yes Domenic Moras, PA-C  traZODone (DESYREL) 100 MG tablet Take 100 mg by mouth at bedtime. 07/14/19  Yes [provider]  Multiple Vitamin (MULTIVITAMIN WITH MINERALS) TABS tablet Take 1 tablet by mouth daily. (May buy over the counter) Patient not taking: Reported on 05/07/2020 01/23/19   Connye Burkitt, NP  ofloxacin (FLOXIN) 0.3 % OTIC solution Place 10 drops into the right ear daily. Patient not taking: Reported on 05/07/2020 01/23/20   Ocie Cornfield T, PA-C  sodium chloride (OCEAN) 0.65 % SOLN nasal spray Place 1 spray into both nostrils as needed for congestion (dry nares). (May buy over the counter) Patient not taking: Reported on 08/10/2019 01/23/19 08/10/19  Connye Burkitt, NP    Scheduled Meds: . enoxaparin (LOVENOX) injection  40 mg Subcutaneous Q24H  . folic acid  1 mg Oral Daily  . multivitamin with minerals  1 tablet Oral  Daily  . sodium chloride (PF)      . sodium chloride flush  3 mL Intravenous Once  . thiamine  100 mg Oral Daily   Or  . thiamine  100 mg Intravenous Daily   Continuous Infusions: . ciprofloxacin 400 mg (05/07/20 0658)  . famotidine (PEPCID) IV 20 mg (05/07/20 1228)  . lactated ringers with kcl 75 mL/hr at 05/07/20 1121  . metronidazole 500 mg (05/07/20 1454)   PRN Meds:.LORazepam **OR** LORazepam, morphine injection, ondansetron (ZOFRAN) IV, promethazine  Allergies as of 05/06/2020   . (No Known Allergies)    Family History  Problem Relation Age of Onset  . Bipolar disorder Mother   . Schizophrenia Father     Social History   Socioeconomic History  . Marital status: Single    Spouse name: Not on file  . Number of children: Not on file  . Years of education: Not on file  . Highest education level: Not on file  Occupational History  . Not on file  Tobacco Use  . Smoking status: Current Every Day Smoker    Packs/day: 0.45    Types: Cigarettes  . Smokeless tobacco: Never Used  Substance and Sexual Activity  . Alcohol use: Yes    Comment:  in rehab  . Drug use: Never  . Sexual activity: Not on file  Other Topics Concern  . Not on file  Social History Narrative  . Not on file   Social Determinants of Health   Financial Resource Strain:   . Difficulty of Paying Living Expenses:   Food Insecurity:   . Worried About Charity fundraiser in the Last Year:   . Arboriculturist in the Last Year:   Transportation Needs:   . Film/video editor (Medical):   Marland Kitchen Lack of Transportation (Non-Medical):   Physical Activity:   . Days of Exercise per Week:   . Minutes of Exercise per Session:   Stress:   . Feeling of Stress :   Social Connections:   . Frequency of Communication with Friends and Family:   . Frequency of Social Gatherings with Friends and Family:   . Attends Religious Services:   . Active Member of Clubs or Organizations:   . Attends Archivist Meetings:   Marland Kitchen Marital Status:   Intimate Partner Violence:   . Fear of Current or Ex-Partner:   . Emotionally Abused:   Marland Kitchen Physically Abused:   . Sexually Abused:     Review of Systems: Review of Systems  Constitutional: Negative for chills and fever.  HENT: Negative for hearing loss and tinnitus.   Eyes: Negative for blurred vision and pain.  Respiratory: Negative for cough and shortness of breath.   Cardiovascular: Negative for chest pain and palpitations.  Gastrointestinal:  Positive for abdominal pain, diarrhea, nausea and vomiting. Negative for blood in stool, constipation, heartburn and melena.  Genitourinary: Negative for dysuria and hematuria.  Musculoskeletal: Negative for falls and joint pain.  Skin: Negative for itching and rash.  Neurological: Negative for seizures and loss of consciousness.  Endo/Heme/Allergies: Negative for polydipsia. Does not bruise/bleed easily.  Psychiatric/Behavioral: Positive for substance abuse (alcohol, in the past). The patient is not nervous/anxious.      Physical Exam: Vital signs: Vitals:   05/07/20 1215 05/07/20 1245  BP: 110/80 (!) 118/96  Pulse: 79 81  Resp: 15 16  Temp:    SpO2: 98% 98%   Last BM Date: 05/07/20  Physical Exam  Constitutional:  He is oriented to person, place, and time. He appears well-developed and well-nourished. No distress.  HENT:  Head: Normocephalic and atraumatic.  Eyes: Conjunctivae and EOM are normal. No scleral icterus.  Cardiovascular: Normal rate, regular rhythm and normal heart sounds.  Pulmonary/Chest: Effort normal and breath sounds normal. No respiratory distress.  Abdominal: Soft. Bowel sounds are normal. He exhibits no distension and no mass. There is abdominal tenderness (mild, diffuse). There is no rebound and no guarding.  Musculoskeletal:        General: No deformity or edema.     Cervical back: Normal range of motion and neck supple.  Neurological: He is alert and oriented to person, place, and time.  Skin: Skin is warm and dry.  Psychiatric: He has a normal mood and affect. His behavior is normal.    GI:  Lab Results: Recent Labs    05/06/20 2242 05/07/20 0734  WBC 6.3 4.6  HGB 16.5 14.1  HCT 48.3 42.3  PLT 219 171   BMET Recent Labs    05/06/20 2242 05/07/20 0734  NA 135 135  K 3.1* 3.4*  CL 94* 98  CO2 27 27  GLUCOSE 137* 109*  BUN <5* <5*  CREATININE 0.76 0.72  CALCIUM 9.2 7.9*   LFT Recent Labs    05/07/20 0734  PROT 6.1*  ALBUMIN 3.6   AST 221*  ALT 183*  ALKPHOS 120  BILITOT 1.2   PT/INR No results for input(s): LABPROT, INR in the last 72 hours.   Studies/Results: CT Abdomen Pelvis W Contrast  Result Date: 05/07/2020 CLINICAL DATA:  Abdominal pain, nausea and vomiting for 2 weeks EXAM: CT ABDOMEN AND PELVIS WITH CONTRAST TECHNIQUE: Multidetector CT imaging of the abdomen and pelvis was performed using the standard protocol following bolus administration of intravenous contrast. CONTRAST:  132m OMNIPAQUE IOHEXOL 300 MG/ML  SOLN COMPARISON:  None. FINDINGS: Lower chest: Lung bases are clear. Normal heart size. No pericardial effusion. Hepatobiliary: Diffuse hepatic hypoattenuation compatible with hepatic steatosis. Some mild spurring along the gallbladder fossa. No focal concerning liver lesion. Smooth liver surface contour. Pancreas: Unremarkable. No pancreatic ductal dilatation or surrounding inflammatory changes. Spleen: Normal in size without focal abnormality. Adrenals/Urinary Tract: Normal adrenal glands Kidneys are normally located with symmetric enhancement. No suspicious renal lesion, urolithiasis or hydronephrosis. There is mild diffuse circumferential bladder wall thickening slightly greater than expected for degree of distension. Stomach/Bowel: Distal esophagus, stomach and duodenal sweep are unremarkable. Portions of the small bowel and much of the colon are fluid-filled without frank small bowel or proximal colonic thickening. However, there is notable edematous mural thickening of the sigmoid and rectum with some mucosal hyperemia. No pneumatosis or portal venous gas. No extraluminal air or fluid. No evidence of obstruction. Normal appendix seen in the right lower quadrant. Vascular/Lymphatic: No significant vascular findings are present. No enlarged abdominal or pelvic lymph nodes. Reproductive: Coarse calcifications seen throughout the prostate, nonspecific though typically benign. No other prostate or seminal  vesicular abnormality. Other: No abdominopelvic free fluid or free gas. No bowel containing hernias. Musculoskeletal: No acute osseous abnormality or suspicious osseous lesion. IMPRESSION: 1. Fluid-filled loops of large and small bowel with more edematous changes of the sigmoid and rectum suggesting a proctocolitis, possibly infectious or inflammatory with lack of formed stool suggesting a rapid transit state. 2. Mild diffuse circumferential bladder wall thickening slightly greater than expected for degree of distension. Recommend correlation with urinalysis to exclude cystitis. 3. Hepatic steatosis. Electronically Signed   By: PLovena Le  M.D.   On: 05/07/2020 04:59    Impression: Acute nausea/vomiting/diarrhea Suspect viral gastroenteritis.  Patient denies marijuana use, though urine is positive for THC.  Cannabinoid hyperemesis syndrome could also be contributing to nausea/vomiting. -CT 5/28 showed fluid-filled loops of large and small bowel with more edematous changes of the sigmoid and rectum suggesting a proctocolitis, possibly infectious or inflammatory with lack of formed stool suggesting a rapid transit state. -Stool studies pending -CBC unremarkable (Hgb 14.1, decreased from 16.5 yesterday- likely dilutional) -Hypokalemia (3.4) -Normal renal function (BUN <5/Cr 0.72)  Acute on chronic diarrhea  Transaminitis -T. Bili 1.2/ AST 221/ ALT 183/ Alk phos 120, decreased from T. bili 1.4/AST 294/ALT 239/alk phos 153 yesterday -acute hepatitis panel negative  Plan: Protonix 83m IV BID ordered.  Continue Cipro/Flagyl.  Continue supportive care and PRN anti-emetics.  Continue to monitor H&H and LFTs.  Once current episode of gastroenteritis resolves, recommend outpatient colonoscopy to further evaluate chronic diarrhea with intermittent hematochezia.  [facility] will follow.   LOS: 0 days   ASalley Slaughter PA-C 05/07/2020, 2:57 PM  Contact #  [phone]

## 2020-05-07 NOTE — H&P (Addendum)
History and Physical    DOA: 05/07/2020  PCP: Dustin Flock, PA-C  Patient coming from: home  Chief Complaint:  nausea, vomiting for 2 weeks  HPI: Brady Mckinney is a 29 y.o. male with history of  alcohol abuse, bipolar 1 disorder, schizoaffective disorder who presents to the emergency department with complaints of nausea, vomiting, and abdominal pain for 2 weeks.  Patient states that he has been having approximately 20 episodes of emesis per day difficulty keeping anything down which made him feel dehydrated and prompted him to seek help.  He also reports associated generalized constant abdominal pain that is worse whenever he tries to eat, no alleviating factors, current pain is a 7/10 in severity.   Additionally he reports chronic diarrhea about 5 episodes per day for 2 years sometimes associated with hematochezia and rectal discomfort.  He was admitted to this [facility] in January for alcohol withdrawal subsequent to which he went to substance abuse rehab center (initially as inpatient for 30 days and then followed as outpatient until late March).  He is never seen a gastroenterologist for diarrhea.  Denies any family history of bowel diseases.  Denies recent travel history or drinking fresh water or hiking.   Does report taking antibiotics for 1 week for tooth infection in February.  Denies fever, chills, chest pain, or dyspnea.  He states his alcohol consumption has drastically reduced since he underwent rehab.  He was sober for 2 months but relapsed again in May.  He states he drinks about twice a week now, last drink was 16 ounces of beer on Wednesday. ED course: Afebrile, vitals stable except for mild tachycardia, 93-98% o2 sat on RA. CBC: No leukocytosis or anemia. CMP: Elevated AST/LFT at 294 and 239 respectively, alk phos and T bili are up as well.  Renal function is preserved.  Mild hypokalemia and hypochloremia.Lipase wnl. Urinalysis: Proteinuria and ketonuria, no obvious  UTI.Hepatitis panel ordered.CT abdomen/pelvis revealed  proctocolitis, started on Ciprofloxacin and Flagyl IV, Dilaudid ordered for additional pain control and admitted to [facility] service by nocturnist with IV abx/IVF/CIWA. Review of Systems: As per HPI otherwise 10 point review of systems negative.    Past Medical History:  Diagnosis Date  . Anxiety   . Bipolar 1 disorder ([place])   . ETOH abuse   . Schizoaffective disorder ([place])     History reviewed. No pertinent surgical history.  Social history:  reports that he has been smoking cigarettes. He has been smoking about 0.45 packs per day. He has never used smokeless tobacco. He reports current alcohol use. He reports that he does not use drugs.   No Known Allergies  Family History  Problem Relation Age of Onset  . Bipolar disorder Mother   . Schizophrenia Father       Prior to Admission medications   Medication Sig Start Date End Date Taking? Authorizing Provider  amoxicillin (AMOXIL) 875 MG tablet Take 1 tablet (875 mg total) by mouth 2 (two) times daily. 01/23/20  Yes Doristine Devoid, PA-C  carbamazepine (TEGRETOL) 100 MG chewable tablet Chew 100 mg by mouth 2 (two) times daily. 07/14/19  Yes [provider]  gabapentin (NEURONTIN) 300 MG capsule Take 300 mg by mouth 3 (three) times daily. 07/14/19  Yes [provider]  hydrOXYzine (ATARAX/VISTARIL) 50 MG tablet Take 50 mg by mouth 3 (three) times daily as needed for anxiety or itching.  07/14/19  Yes [provider]  ibuprofen (ADVIL) 600 MG tablet Take 1 tablet (  600 mg total) by mouth every 6 (six) hours as needed. Patient taking differently: Take 600 mg by mouth every 6 (six) hours as needed for headache.  08/10/19  Yes Domenic Moras, PA-C  traZODone (DESYREL) 100 MG tablet Take 100 mg by mouth at bedtime. 07/14/19  Yes [provider]  Multiple Vitamin (MULTIVITAMIN WITH MINERALS) TABS tablet Take 1 tablet by mouth daily. (May buy over the  counter) Patient not taking: Reported on 05/07/2020 01/23/19   Connye Burkitt, NP  ofloxacin (FLOXIN) 0.3 % OTIC solution Place 10 drops into the right ear daily. Patient not taking: Reported on 05/07/2020 01/23/20   Ocie Cornfield T, PA-C  sodium chloride (OCEAN) 0.65 % SOLN nasal spray Place 1 spray into both nostrils as needed for congestion (dry nares). (May buy over the counter) Patient not taking: Reported on 08/10/2019 01/23/19 08/10/19  Connye Burkitt, NP    Physical Exam: Vitals:   05/07/20 0700 05/07/20 0715 05/07/20 0730 05/07/20 0945  BP: 120/82 125/78 118/86 112/68  Pulse: 82 80 85 82  Resp:  _0 Temp:      TempSrc:      SpO2: 94% 93% 96% 98%    Constitutional: NAD, calm, comfortable Eyes: PERRL, lids and conjunctivae normal ENMT: Mucous membranes are moist. Posterior pharynx clear of any exudate or lesions.Normal dentition.  Neck: normal, supple, no masses, no thyromegaly Respiratory: clear to auscultation bilaterally, no wheezing, no crackles. Normal respiratory effort. No accessory muscle use.  Cardiovascular: Regular rate and rhythm, no murmurs / rubs / gallops. No extremity edema. 2+ pedal pulses. No carotid bruits.  Abdomen: no tenderness, no masses palpated. No hepatosplenomegaly. Bowel sounds positive.  Musculoskeletal: no clubbing / cyanosis. No joint deformity upper and lower extremities. Good ROM, no contractures. Normal muscle tone.  Neurologic: CN 2-12 grossly intact. Sensation intact, DTR normal. Strength 5/5 in all 4.  Psychiatric: Normal judgment and insight. Alert and oriented x 3. Normal mood.  SKIN/catheters: no rashes, lesions, ulcers. No induration  Labs on Admission: I have personally reviewed following labs and imaging studies  CBC: Recent Labs  Lab 05/06/20 2242 05/07/20 0734  WBC 6.3 4.6  HGB 16.5 14.1  HCT 48.3 42.3  MCV 90.4 91.6  PLT 219 314   Basic Metabolic Panel: Recent Labs  Lab 05/06/20 2242 05/07/20 0734  NA 135 135  K  3.1* 3.4*  CL 94* 98  CO2 27 27  GLUCOSE 137* 109*  BUN <5* <5*  CREATININE 0.76 0.72  CALCIUM 9.2 7.9*  MG  --  1.9  PHOS  --  3.6   GFR: CrCl cannot be calculated (Unknown ideal weight.). Recent Labs  Lab 05/06/20 2242 05/07/20 0734  WBC 6.3 4.6   Liver Function Tests: Recent Labs  Lab 05/06/20 2242 05/07/20 0734  AST 294* 221*  ALT 239* 183*  ALKPHOS 153* 120  BILITOT 1.4* 1.2  PROT 7.7 6.1*  ALBUMIN 4.6 3.6   Recent Labs  Lab 05/06/20 2242  LIPASE 36   No results for input(s): AMMONIA in the last 168 hours. Coagulation Profile: No results for input(s): INR, PROTIME in the last 168 hours. Cardiac Enzymes: No results for input(s): CKTOTAL, CKMB, CKMBINDEX, TROPONINI in the last 168 hours. BNP (last 3 results) No results for input(s): PROBNP in the last 8760 hours. HbA1C: No results for input(s): HGBA1C in the last 72 hours. CBG: No results for input(s): GLUCAP in the last 168 hours. Lipid Profile: No results for input(s): CHOL, HDL,  LDLCALC, TRIG, CHOLHDL, LDLDIRECT in the last 72 hours. Thyroid Function Tests: No results for input(s): TSH, T4TOTAL, FREET4, T3FREE, THYROIDAB in the last 72 hours. Anemia Panel: No results for input(s): VITAMINB12, FOLATE, FERRITIN, TIBC, IRON, RETICCTPCT in the last 72 hours. Urine analysis:    Component Value Date/Time   COLORURINE AMBER (A) 05/06/2020 2242   APPEARANCEUR CLEAR 05/06/2020 2242   LABSPEC 1.018 05/06/2020 2242   PHURINE 6.0 05/06/2020 2242   GLUCOSEU NEGATIVE 05/06/2020 2242   HGBUR NEGATIVE 05/06/2020 2242   BILIRUBINUR NEGATIVE 05/06/2020 2242   KETONESUR 5 (A) 05/06/2020 2242   PROTEINUR 100 (A) 05/06/2020 2242   NITRITE NEGATIVE 05/06/2020 2242   LEUKOCYTESUR NEGATIVE 05/06/2020 2242    Radiological Exams on Admission: Personally reviewed  CT Abdomen Pelvis W Contrast  Result Date: 05/07/2020 CLINICAL DATA:  Abdominal pain, nausea and vomiting for 2 weeks EXAM: CT ABDOMEN AND PELVIS WITH  CONTRAST TECHNIQUE: Multidetector CT imaging of the abdomen and pelvis was performed using the standard protocol following bolus administration of intravenous contrast. CONTRAST:  164m OMNIPAQUE IOHEXOL 300 MG/ML  SOLN COMPARISON:  None. FINDINGS: Lower chest: Lung bases are clear. Normal heart size. No pericardial effusion. Hepatobiliary: Diffuse hepatic hypoattenuation compatible with hepatic steatosis. Some mild spurring along the gallbladder fossa. No focal concerning liver lesion. Smooth liver surface contour. Pancreas: Unremarkable. No pancreatic ductal dilatation or surrounding inflammatory changes. Spleen: Normal in size without focal abnormality. Adrenals/Urinary Tract: Normal adrenal glands Kidneys are normally located with symmetric enhancement. No suspicious renal lesion, urolithiasis or hydronephrosis. There is mild diffuse circumferential bladder wall thickening slightly greater than expected for degree of distension. Stomach/Bowel: Distal esophagus, stomach and duodenal sweep are unremarkable. Portions of the small bowel and much of the colon are fluid-filled without frank small bowel or proximal colonic thickening. However, there is notable edematous mural thickening of the sigmoid and rectum with some mucosal hyperemia. No pneumatosis or portal venous gas. No extraluminal air or fluid. No evidence of obstruction. Normal appendix seen in the right lower quadrant. Vascular/Lymphatic: No significant vascular findings are present. No enlarged abdominal or pelvic lymph nodes. Reproductive: Coarse calcifications seen throughout the prostate, nonspecific though typically benign. No other prostate or seminal vesicular abnormality. Other: No abdominopelvic free fluid or free gas. No bowel containing hernias. Musculoskeletal: No acute osseous abnormality or suspicious osseous lesion. IMPRESSION: 1. Fluid-filled loops of large and small bowel with more edematous changes of the sigmoid and rectum suggesting a  proctocolitis, possibly infectious or inflammatory with lack of formed stool suggesting a rapid transit state. 2. Mild diffuse circumferential bladder wall thickening slightly greater than expected for degree of distension. Recommend correlation with urinalysis to exclude cystitis. 3. Hepatic steatosis. Electronically Signed   By: PLovena LeM.D.   On: 05/07/2020 04:59    EKG: Not obtained in the ED    Assessment and Plan:   Principal Problem:   Colitis Active Problems:   Alcohol abuse   Schizoaffective disorder (HCC)   Hypokalemia   Transaminitis    1. Proctocolitis: causing nausea, vomiting and diarrhea with abdominal pain.  Noted that GI panel has been sent for testing and patient placed on enteric precautions.  Check stool for C. Difficile as well (took amoxicillin in late February).  Agree with empiric antibiotics, IV fluids.  Will start clear liquid diet and monitor response.  Given chronicity of diarrhea, if infectious work-up negative, patient may need colonoscopy for inflammatory bowel disease evaluation.  Will give IV Pepcid for possible alcoholic gastritis.  Avoid PPI until C. difficile ruled out.  Will consult GI.  2.  Transaminitis: Could be related to alcohol use.  Appears to be improving with IV hydration.  Counseled to quit alcohol. Continue to monitor serial labs and follow-up hepatitis panel given GI symptoms.  3.  Chronic alcohol abuse: Last drink was yesterday.  Watch for signs of withdrawal.  Agree with CIWA protocol.  4.  Mild hypokalemia: Secondary to GI losses likely.  Replaced in ED and repeat level at 3.4.  Will give additional replacement through IV fluids.  Magnesium level at 1.9.  5.  Schizoaffective/bipolar disorder: Resume home medications when able to tolerate p.o and liver enzymes improve.  DVT prophylaxis: Lovenox  COVID screen: Negative  Code Status: Full code    Patient/Family Communication: Discussed with patient and all questions answered to  satisfaction.  Consults called: [facility] Admission status :I certify that at the point of admission it is my clinical judgment that the patient will require inpatient hospital care spanning beyond 2 midnights from the point of admission due to high intensity of service and high frequency of surveillance required.Inpatient status is judged to be reasonable and necessary in order to provide the required intensity of service to ensure the patient's safety. The patient's presenting symptoms, physical exam findings, and initial radiographic and laboratory data in the context of their chronic comorbidities is felt to place them at high risk for further clinical deterioration. The following factors support the patient status of inpatient : Proctocolitis requiring IV antibiotics, IV hydration and slow diet advancement     Guilford Shi MD [facility] Pager in [place]  If 7PM-7AM, please contact night-coverage [email or web address]   05/07/2020, 10:46 AM

## 2020-05-07 NOTE — ED Provider Notes (Addendum)
[facility] DEPT Provider Note   CSN: [id number] Arrival date & time: 05/06/20  1959     History Chief Complaint  Patient presents with  . Abdominal Pain    Brady Mckinney is a 29 y.o. male anxiety, alcohol abuse, bipolar 1 disorder, schizoaffective disorder who presents to the emergency department with complaints of nausea, vomiting, and abdominal pain for 2 weeks.  Patient states that he is having approximately 20 episodes of emesis per day, he is having difficulty keeping anything down. He has associated generalized constant abdominal pain that is worse whenever he tries to eat, no alleviating factors, current pain is a 7/10 in severity.  He also notes loose stools which are nonbloody. Also having pain with urination and with bowel movements throughout abdomen and rectum.  Denies fever, chills, melena, hematochezia, chest pain, or dyspnea.  No recent foreign travel.  No recent antibiotics.  He states that he drinks alcohol occasionally, 1-2 beers at a time, not daily.  Last drank yesterday.  Denies use of Tylenol.Marland Kitchen  HPI     Past Medical History:  Diagnosis Date  . Anxiety   . Bipolar 1 disorder ([place])   . ETOH abuse   . Schizoaffective disorder [facility])     Patient Active Problem List   Diagnosis Date Noted  . Alcohol abuse 01/20/2019  . MDD (major depressive disorder), severe ([place]) 01/17/2019    History reviewed. No pertinent surgical history.     Family History  Problem Relation Age of Onset  . Bipolar disorder Mother   . Schizophrenia Father     Social History   Tobacco Use  . Smoking status: Current Every Day Smoker    Packs/day: 0.45    Types: Cigarettes  . Smokeless tobacco: Never Used  Substance Use Topics  . Alcohol use: Yes    Comment:  in rehab  . Drug use: Never    Home Medications Prior to Admission medications   Medication Sig Start Date End Date Taking? Authorizing Provider  amoxicillin (AMOXIL) 875 MG tablet  Take 1 tablet (875 mg total) by mouth 2 (two) times daily. 01/23/20   Doristine Devoid, PA-C  carbamazepine (TEGRETOL) 100 MG chewable tablet Chew 100 mg by mouth 2 (two) times daily. 07/14/19   [provider]  gabapentin (NEURONTIN) 300 MG capsule Take 300 mg by mouth 3 (three) times daily. 07/14/19   [provider]  hydrOXYzine (ATARAX/VISTARIL) 50 MG tablet Take 50 mg by mouth 3 (three) times daily as needed for anxiety or itching.  07/14/19   [provider]  ibuprofen (ADVIL) 600 MG tablet Take 1 tablet (600 mg total) by mouth every 6 (six) hours as needed. 08/10/19   Domenic Moras, PA-C  Multiple Vitamin (MULTIVITAMIN WITH MINERALS) TABS tablet Take 1 tablet by mouth daily. (May buy over the counter) 01/23/19   Connye Burkitt, NP  ofloxacin (FLOXIN) 0.3 % OTIC solution Place 10 drops into the right ear daily. 01/23/20   Doristine Devoid, PA-C  traZODone (DESYREL) 100 MG tablet Take 100 mg by mouth at bedtime. 07/14/19   [provider]  sodium chloride (OCEAN) 0.65 % SOLN nasal spray Place 1 spray into both nostrils as needed for congestion (dry nares). (May buy over the counter) Patient not taking: Reported on 08/10/2019 01/23/19 08/10/19  Connye Burkitt, NP    Allergies    Patient has no known allergies.  Review of Systems   Review of Systems  Constitutional: Negative for  chills and fever.  Respiratory: Negative for shortness of breath.   Cardiovascular: Negative for chest pain.  Gastrointestinal: Positive for abdominal pain, diarrhea, nausea and vomiting. Negative for blood in stool.  Neurological: Negative for syncope.  All other systems reviewed and are negative.   Physical Exam Updated Vital Signs BP (!) 141/85 (BP Location: Left Arm)   Pulse (!) 106   Temp 99.4 F (37.4 C) (Oral)   Resp 16   SpO2 92%   Physical Exam Vitals and nursing note reviewed.  Constitutional:      General: He is not in acute distress.    Appearance: He is  well-developed. He is not toxic-appearing.  HENT:     Head: Normocephalic and atraumatic.     Mouth/Throat:     Comments: Dry mucous membranes. Eyes:     General:        Right eye: No discharge.        Left eye: No discharge.     Conjunctiva/sclera: Conjunctivae normal.  Cardiovascular:     Rate and Rhythm: Normal rate and regular rhythm.  Pulmonary:     Effort: Pulmonary effort is normal. No respiratory distress.     Breath sounds: Normal breath sounds. No wheezing, rhonchi or rales.  Abdominal:     General: There is no distension.     Palpations: Abdomen is soft.     Tenderness: There is generalized abdominal tenderness. There is guarding (mild).  Musculoskeletal:     Cervical back: Neck supple.  Skin:    General: Skin is warm and dry.     Findings: No rash.  Neurological:     Mental Status: He is alert.     Comments: Clear speech.   Psychiatric:        Behavior: Behavior normal.    ED Results / Procedures / Treatments   Labs (all labs ordered are listed, but only abnormal results are displayed) Labs Reviewed  COMPREHENSIVE METABOLIC PANEL - Abnormal; Notable for the following components:      Result Value   Potassium 3.1 (*)    Chloride 94 (*)    Glucose, Bld 137 (*)    BUN <5 (*)    AST 294 (*)    ALT 239 (*)    Alkaline Phosphatase 153 (*)    Total Bilirubin 1.4 (*)    All other components within normal limits  URINALYSIS, ROUTINE W REFLEX MICROSCOPIC - Abnormal; Notable for the following components:   Color, Urine AMBER (*)    Ketones, ur 5 (*)    Protein, ur 100 (*)    Bacteria, UA RARE (*)    All other components within normal limits  LIPASE, BLOOD  CBC    EKG None  Radiology CT Abdomen Pelvis W Contrast  Result Date: 05/07/2020 CLINICAL DATA:  Abdominal pain, nausea and vomiting for 2 weeks EXAM: CT ABDOMEN AND PELVIS WITH CONTRAST TECHNIQUE: Multidetector CT imaging of the abdomen and pelvis was performed using the standard protocol following  bolus administration of intravenous contrast. CONTRAST:  118m OMNIPAQUE IOHEXOL 300 MG/ML  SOLN COMPARISON:  None. FINDINGS: Lower chest: Lung bases are clear. Normal heart size. No pericardial effusion. Hepatobiliary: Diffuse hepatic hypoattenuation compatible with hepatic steatosis. Some mild spurring along the gallbladder fossa. No focal concerning liver lesion. Smooth liver surface contour. Pancreas: Unremarkable. No pancreatic ductal dilatation or surrounding inflammatory changes. Spleen: Normal in size without focal abnormality. Adrenals/Urinary Tract: Normal adrenal glands Kidneys are normally located with symmetric enhancement. No suspicious renal  lesion, urolithiasis or hydronephrosis. There is mild diffuse circumferential bladder wall thickening slightly greater than expected for degree of distension. Stomach/Bowel: Distal esophagus, stomach and duodenal sweep are unremarkable. Portions of the small bowel and much of the colon are fluid-filled without frank small bowel or proximal colonic thickening. However, there is notable edematous mural thickening of the sigmoid and rectum with some mucosal hyperemia. No pneumatosis or portal venous gas. No extraluminal air or fluid. No evidence of obstruction. Normal appendix seen in the right lower quadrant. Vascular/Lymphatic: No significant vascular findings are present. No enlarged abdominal or pelvic lymph nodes. Reproductive: Coarse calcifications seen throughout the prostate, nonspecific though typically benign. No other prostate or seminal vesicular abnormality. Other: No abdominopelvic free fluid or free gas. No bowel containing hernias. Musculoskeletal: No acute osseous abnormality or suspicious osseous lesion. IMPRESSION: 1. Fluid-filled loops of large and small bowel with more edematous changes of the sigmoid and rectum suggesting a proctocolitis, possibly infectious or inflammatory with lack of formed stool suggesting a rapid transit state. 2. Mild  diffuse circumferential bladder wall thickening slightly greater than expected for degree of distension. Recommend correlation with urinalysis to exclude cystitis. 3. Hepatic steatosis. Electronically Signed   By: Lovena Le M.D.   On: 05/07/2020 04:59    Procedures Procedures (including critical care time)  Medications Ordered in ED Medications  sodium chloride flush (NS) 0.9 % injection 3 mL (3 mLs Intravenous Not Given 05/07/20 0307)    ED Course  I have reviewed the triage vital signs and the nursing notes.  Pertinent labs & imaging results that were available during my care of the patient were reviewed by me and considered in my medical decision making (see chart for details).   MDM Rules/Calculators/A&P                     Patient presents to the ED with complaints of N/V/D & abdominal pain x 2 weeks.  Patient is nontoxic, resting comfortably, vitals without significant abnormality.  Generalized abdominal tenderness noted. Additional history obtained:  Additional history obtained from chart review & nursing note review. .  Lab Tests:  I Ordered, reviewed, and interpreted labs, which included:  CBC: No leukocytosis or anemia CMP: Elevated AST/LFT at 294 and 239 respectively, alk phos and T bili are up as well.  Renal function is preserved.  Mild hypokalemia and hypochloremia. Lipase: Within normal limits Urinalysis: Proteinuria and ketonuria, no obvious UTI. Hepatitis panel ordered.  Imaging Studies ordered:  I ordered imaging studies which included CT abdomen/pelvis w contrast, I independently visualized and interpreted imaging which showed  hernias. Musculoskeletal: No acute osseous abnormality or suspicious osseous lesion. IMPRESSION: 1. Fluid-filled loops of large and small bowel with more edematous changes of the sigmoid and rectum suggesting a proctocolitis, possibly infectious or inflammatory with lack of formed stool suggesting a rapid transit state. 2. Mild diffuse  circumferential bladder wall thickening slightly greater than expected for degree of distension. Recommend correlation with urinalysis to exclude cystitis. 3. Hepatic steatosis  ED Course:  Patient with findings of proctocolitis on CT abdomen/pelvis which correlates with his history.  He also has transaminitis noted.  On reassessment following analgesics and antiemetics, patient has had no change in his pain, he remains very uncomfortable, will plan for admission.  Will start Ciprofloxacin and Flagyl IV, Dilaudid ordered for additional pain control, will also replete potassium. No signs of EtOH withdrawal at this time.   06:15: CONSULT: Discussed with hospitalist Dr. Marcello Moores- accepts admission.  Findings and plan of care discussed with supervising physician Dr. Florina Ou who is in agreement.   Portions of this note were generated with [employer]. Dictation errors may occur despite best attempts at proofreading.  Final Clinical Impression(s) / ED Diagnoses Final diagnoses:  Proctocolitis  Elevated LFTs  Hypokalemia    Rx / DC Orders ED Discharge Orders    None       Amaryllis Dyke, PA-C 05/07/20 4035  Brady Mckinney was evaluated in Emergency Department on 05/07/2020 for the symptoms described in the history of present illness. He/she was evaluated in the context of the global COVID-19 pandemic, which necessitated consideration that the patient might be at risk for infection with the SARS-CoV-2 virus that causes COVID-19. Institutional protocols and algorithms that pertain to the evaluation of patients at risk for COVID-19 are in a state of rapid change based on information released by regulatory bodies including the CDC and federal and state organizations. These policies and algorithms were followed during the patient's care in the ED.     Leafy Kindle 05/07/20 0616    Shanon Rosser, MD 05/07/20 [phone]

## 2020-05-07 NOTE — ED Notes (Signed)
ED TO INPATIENT HANDOFF REPORT  ED Nurse Name and Phone #: [phone]  S Name/Age/Gender Brady Mckinney 29 y.o. male Room/Bed: WA07/WA07  Code Status   Code Status: Prior  Home/SNF/Other Home Patient oriented to: self, place, time and situation Is this baseline? Yes   Triage Complete: Triage complete  Chief Complaint Colitis [K52.9]  Triage Note Pt reports vomiting, abdominal pain and weakness x2 weeks. States that he cannot keep anything down. A&Ox4.     Allergies No Known Allergies  Level of Care/Admitting Diagnosis ED Disposition    ED Disposition Condition Comment   Admit  Hospital Area: [facility] [[id number]]  Level of Care: Telemetry [5]  Admit to tele based on following criteria: Other see comments  Comments: etoh hx  May [facility] patient to [facility] or [facility] if equivalent level of care is available:: Yes  Covid Evaluation: Confirmed COVID Negative  Diagnosis: Colitis [403474]  Admitting Physician: Clance Boll [[id number]]  Attending Physician: Clance Boll [[id number]]  Estimated length of stay: 3 - 4 days  Certification:: I certify this patient will need inpatient services for at least 2 midnights       B Medical/Surgery History Past Medical History:  Diagnosis Date  . Anxiety   . Bipolar 1 disorder ([place])   . ETOH abuse   . Schizoaffective disorder ([place])    History reviewed. No pertinent surgical history.   A IV Location/Drains/Wounds Patient Lines/Drains/Airways Status   Active Line/Drains/Airways    Name:   Placement date:   Placement time:   Site:   Days:   Peripheral IV 05/07/20 Hand   05/07/20    0358    Hand   less than 1   Peripheral IV 05/07/20 Right Arm   05/07/20    0557    Arm   less than 1   Peripheral IV 05/07/20 Right Hand   05/07/20    0605    Hand   less than 1          Intake/Output Last 24 hours No intake or output data in the 24 hours ending 05/07/20 1326  Labs/Imaging Results for  orders placed or performed during the hospital encounter of 05/07/20 (from the past 48 hour(s))  Lipase, blood     Status: None   Collection Time: 05/06/20 10:42 PM  Result Value Ref Range   Lipase 36 11 - 51 U/L    Comment: Performed at [facility], [place] [address], [place], [place] [address]  Comprehensive metabolic panel     Status: Abnormal   Collection Time: 05/06/20 10:42 PM  Result Value Ref Range   Sodium 135 135 - 145 mmol/L   Potassium 3.1 (L) 3.5 - 5.1 mmol/L   Chloride 94 (L) 98 - 111 mmol/L   CO2 27 22 - 32 mmol/L   Glucose, Bld 137 (H) 70 - 99 mg/dL    Comment: Glucose reference range applies only to samples taken after fasting for at least 8 hours.   BUN <5 (L) 6 - 20 mg/dL   Creatinine, Ser 0.76 0.61 - 1.24 mg/dL   Calcium 9.2 8.9 - 10.3 mg/dL   Total Protein 7.7 6.5 - 8.1 g/dL   Albumin 4.6 3.5 - 5.0 g/dL   AST 294 (H) 15 - 41 U/L   ALT 239 (H) 0 - 44 U/L   Alkaline Phosphatase 153 (H) 38 - 126 U/L   Total Bilirubin 1.4 (H) 0.3 - 1.2 mg/dL  GFR calc non Af Amer >60 >60 mL/min   GFR calc Af Amer >60 >60 mL/min   Anion gap 14 5 - 15    Comment: Performed at [facility], [address], [place], [place] [address]  CBC     Status: None   Collection Time: 05/06/20 10:42 PM  Result Value Ref Range   WBC 6.3 4.0 - 10.5 K/uL   RBC 5.34 4.22 - 5.81 MIL/uL   Hemoglobin 16.5 13.0 - 17.0 g/dL   HCT 40.9 73.5 - 32.9 %   MCV 90.4 80.0 - 100.0 fL   MCH 30.9 26.0 - 34.0 pg   MCHC 34.2 30.0 - 36.0 g/dL   RDW 92.4 26.8 - 34.1 %   Platelets 219 150 - 400 K/uL   nRBC 0.0 0.0 - 0.2 %    Comment: Performed at [facility], [address], [place], [place] [address]  Urinalysis, Routine w reflex microscopic     Status: Abnormal   Collection Time: 05/06/20 10:42 PM  Result Value Ref Range   Color, Urine AMBER (A) YELLOW    Comment: BIOCHEMICALS MAY BE AFFECTED BY COLOR   APPearance CLEAR CLEAR   Specific Gravity,  Urine 1.018 1.005 - 1.030   pH 6.0 5.0 - 8.0   Glucose, UA NEGATIVE NEGATIVE mg/dL   Hgb urine dipstick NEGATIVE NEGATIVE   Bilirubin Urine NEGATIVE NEGATIVE   Ketones, ur 5 (A) NEGATIVE mg/dL   Protein, ur 979 (A) NEGATIVE mg/dL   Nitrite NEGATIVE NEGATIVE   Leukocytes,Ua NEGATIVE NEGATIVE   RBC / HPF 0-5 0 - 5 RBC/hpf   WBC, UA 11-20 0 - 5 WBC/hpf   Bacteria, UA RARE (A) NONE SEEN   Squamous Epithelial / LPF 0-5 0 - 5   Mucus PRESENT    Hyaline Casts, UA PRESENT     Comment: Performed at [facility], [address] [place], [place] [address]  Hepatitis panel, acute     Status: None   Collection Time: 05/07/20  3:37 AM  Result Value Ref Range   Hepatitis B Surface Ag NON REACTIVE NON REACTIVE   HCV Ab NON REACTIVE NON REACTIVE    Comment: (NOTE) Nonreactive HCV antibody screen is consistent with no HCV infections,  unless recent infection is suspected or other evidence exists to indicate HCV infection.    Hep A IgM NON REACTIVE NON REACTIVE   Hep B C IgM NON REACTIVE NON REACTIVE    Comment: Performed at [facility], [address] [place], [place] [address]  SARS Coronavirus 2 by RT PCR (hospital order, performed in [facility] hospital lab) Nasopharyngeal Nasopharyngeal Swab     Status: None   Collection Time: 05/07/20  5:30 AM   Specimen: Nasopharyngeal Swab  Result Value Ref Range   SARS Coronavirus 2 NEGATIVE NEGATIVE    Comment: (NOTE) SARS-CoV-2 target nucleic acids are NOT DETECTED. The SARS-CoV-2 RNA is generally detectable in upper and lower respiratory specimens during the acute phase of infection. The lowest concentration of SARS-CoV-2 viral copies this assay can detect is 250 copies / mL. A negative result does not preclude SARS-CoV-2 infection and should not be used as the sole basis for treatment or other patient management decisions.  A negative result may occur with improper specimen collection / handling, submission of specimen  other than nasopharyngeal swab, presence of viral mutation(s) within the areas targeted by this assay, and inadequate number of viral copies (<250 copies /  mL). A negative result must be combined with clinical observations, patient history, and epidemiological information. Fact Sheet for Patients:   BoilerBrush.com.cy Fact Sheet for Healthcare Providers: https://pope.com/ This test is not yet approved or cleared  by the [place] FDA and has been authorized for detection and/or diagnosis of SARS-CoV-2 by FDA under an Emergency Use Authorization (EUA).  This EUA will remain in effect (meaning this test can be used) for the duration of the COVID-19 declaration under Section 564(b)(1) of the Act, 21 U.S.C. section 360bbb-3(b)(1), unless the authorization is terminated or revoked sooner. Performed at [facility], [address] [place], [place] [address]   Comprehensive metabolic panel     Status: Abnormal   Collection Time: 05/07/20  7:34 AM  Result Value Ref Range   Sodium 135 135 - 145 mmol/L   Potassium 3.4 (L) 3.5 - 5.1 mmol/L   Chloride 98 98 - 111 mmol/L   CO2 27 22 - 32 mmol/L   Glucose, Bld 109 (H) 70 - 99 mg/dL    Comment: Glucose reference range applies only to samples taken after fasting for at least 8 hours.   BUN <5 (L) 6 - 20 mg/dL   Creatinine, Ser 1.96 0.61 - 1.24 mg/dL   Calcium 7.9 (L) 8.9 - 10.3 mg/dL   Total Protein 6.1 (L) 6.5 - 8.1 g/dL   Albumin 3.6 3.5 - 5.0 g/dL   AST 222 (H) 15 - 41 U/L   ALT 183 (H) 0 - 44 U/L   Alkaline Phosphatase 120 38 - 126 U/L   Total Bilirubin 1.2 0.3 - 1.2 mg/dL   GFR calc non Af Amer >60 >60 mL/min   GFR calc Af Amer >60 >60 mL/min   Anion gap 10 5 - 15    Comment: Performed at [facility], [address], [place], [place] [address]  Magnesium     Status: None   Collection Time: 05/07/20  7:34 AM  Result Value Ref Range   Magnesium  1.9 1.7 - 2.4 mg/dL    Comment: Performed at [facility], [address], [place], [place] [address]  Phosphorus     Status: None   Collection Time: 05/07/20  7:34 AM  Result Value Ref Range   Phosphorus 3.6 2.5 - 4.6 mg/dL    Comment: Performed at [facility], [address], [place], [place] [address]  CBC     Status: None   Collection Time: 05/07/20  7:34 AM  Result Value Ref Range   WBC 4.6 4.0 - 10.5 K/uL   RBC 4.62 4.22 - 5.81 MIL/uL   Hemoglobin 14.1 13.0 - 17.0 g/dL   HCT 14.4 81.8 - 56.3 %   MCV 91.6 80.0 - 100.0 fL   MCH 30.5 26.0 - 34.0 pg   MCHC 33.3 30.0 - 36.0 g/dL   RDW 14.9 70.2 - 63.7 %   Platelets 171 150 - 400 K/uL   nRBC 0.0 0.0 - 0.2 %    Comment: Performed at [facility], [address] [place], [place] [address]   CT Abdomen Pelvis W Contrast  Result Date: 05/07/2020 CLINICAL DATA:  Abdominal pain, nausea and vomiting for 2 weeks EXAM: CT ABDOMEN AND PELVIS WITH CONTRAST TECHNIQUE: Multidetector CT imaging of the abdomen and pelvis was performed using the standard protocol following bolus administration of intravenous contrast. CONTRAST:  OMNIPAQUE IOHEXOL 300 MG/ML  SOLN COMPARISON:  None. FINDINGS: Lower chest: Lung bases  are clear. Normal heart size. No pericardial effusion. Hepatobiliary: Diffuse hepatic hypoattenuation compatible with hepatic steatosis. Some mild spurring along the gallbladder fossa. No focal concerning liver lesion. Smooth liver surface contour. Pancreas: Unremarkable. No pancreatic ductal dilatation or surrounding inflammatory changes. Spleen: Normal in size without focal abnormality. Adrenals/Urinary Tract: Normal adrenal glands Kidneys are normally located with symmetric enhancement. No suspicious renal lesion, urolithiasis or hydronephrosis. There is mild diffuse circumferential bladder wall thickening slightly greater than expected for degree of distension. Stomach/Bowel:  Distal esophagus, stomach and duodenal sweep are unremarkable. Portions of the small bowel and much of the colon are fluid-filled without frank small bowel or proximal colonic thickening. However, there is notable edematous mural thickening of the sigmoid and rectum with some mucosal hyperemia. No pneumatosis or portal venous gas. No extraluminal air or fluid. No evidence of obstruction. Normal appendix seen in the right lower quadrant. Vascular/Lymphatic: No significant vascular findings are present. No enlarged abdominal or pelvic lymph nodes. Reproductive: Coarse calcifications seen throughout the prostate, nonspecific though typically benign. No other prostate or seminal vesicular abnormality. Other: No abdominopelvic free fluid or free gas. No bowel containing hernias. Musculoskeletal: No acute osseous abnormality or suspicious osseous lesion. IMPRESSION: 1. Fluid-filled loops of large and small bowel with more edematous changes of the sigmoid and rectum suggesting a proctocolitis, possibly infectious or inflammatory with lack of formed stool suggesting a rapid transit state. 2. Mild diffuse circumferential bladder wall thickening slightly greater than expected for degree of distension. Recommend correlation with urinalysis to exclude cystitis. 3. Hepatic steatosis. Electronically Signed   By: Kreg ShropshirePrice  DeHay M.D.   On: 05/07/2020 04:59    Pending Labs Unresulted Labs (From admission, onward)    Start     Ordered   05/07/20 1042  C Difficile Quick Screen w PCR reflex  (C Difficile quick screen w PCR reflex panel)  Once, for 24 hours,   STAT     05/07/20 1042   05/07/20 0615  Urine rapid drug screen (hosp performed)  ONCE - STAT,   STAT     05/07/20 0614   05/07/20 0604  Gastrointestinal Panel by PCR , Stool  (Gastrointestinal Panel by PCR, Stool                                                                                                                                                     *Does Not  include CLOSTRIDIUM DIFFICILE testing.**If CDIFF testing is needed, select the C Difficile Quick Screen w PCR reflex order below)  Once,   STAT     05/07/20 0603   05/07/20 0528  Urine culture  Add-on,   AD     05/07/20 0527          Vitals/Pain Today's Vitals   05/07/20 1100 05/07/20 1145 05/07/20 1215 05/07/20 1245  BP:  120/82  110/80 (!) 118/96  Pulse:  74 79 81  Resp: 16 16 15 16   Temp: (!) 97.4 F (36.3 C)     TempSrc: Oral     SpO2:  95% 98% 98%  PainSc:        Isolation Precautions Enteric precautions (UV disinfection)  Medications Medications  sodium chloride flush (NS) 0.9 % injection 3 mL (3 mLs Intravenous Not Given 05/07/20 0307)  sodium chloride (PF) 0.9 % injection (has no administration in time range)  LORazepam (ATIVAN) tablet 1-4 mg (has no administration in time range)    Or  LORazepam (ATIVAN) injection 1-4 mg (has no administration in time range)  thiamine tablet 100 mg (100 mg Oral Given 05/07/20 0951)    Or  thiamine (B-1) injection 100 mg ( Intravenous See Alternative 05/07/20 0951)  folic acid (FOLVITE) tablet 1 mg (1 mg Oral Given 05/07/20 0951)  multivitamin with minerals tablet 1 tablet (1 tablet Oral Given 05/07/20 0951)  morphine 2 MG/ML injection 2 mg (2 mg Intravenous Given 05/07/20 0848)  metroNIDAZOLE (FLAGYL) IVPB 500 mg (has no administration in time range)  ciprofloxacin (CIPRO) IVPB 400 mg (400 mg Intravenous New Bag/Given 05/07/20 0658)  enoxaparin (LOVENOX) injection 40 mg (40 mg Subcutaneous Given 05/07/20 1121)  lactated ringers 1,000 mL with potassium chloride 20 mEq infusion ( Intravenous New Bag/Given 05/07/20 1121)  ondansetron (ZOFRAN) injection 4 mg (has no administration in time range)  promethazine (PHENERGAN) injection 12.5 mg (has no administration in time range)  famotidine (PEPCID) IVPB 20 mg premix (20 mg Intravenous New Bag/Given 05/07/20 1228)  sodium chloride 0.9 % bolus 1,000 mL (0 mLs Intravenous Stopped 05/07/20 0512)   ondansetron (ZOFRAN) injection 4 mg (4 mg Intravenous Given 05/07/20 0359)  morphine 4 MG/ML injection 4 mg (4 mg Intravenous Given 05/07/20 0359)  iohexol (OMNIPAQUE) 300 MG/ML solution 100 mL (100 mLs Intravenous Contrast Given 05/07/20 0437)  HYDROmorphone (DILAUDID) injection 1 mg (1 mg Intravenous Given 05/07/20 0600)  metroNIDAZOLE (FLAGYL) IVPB 500 mg (0 mg Intravenous Stopped 05/07/20 0654)  potassium chloride SA (KLOR-CON) CR tablet 40 mEq (40 mEq Oral Given 05/07/20 0559)  potassium chloride 10 mEq in 100 mL IVPB (0 mEq Intravenous Stopped 05/07/20 0733)    Mobility walks Low fall risk   Focused Assessments GI   R Recommendations: See Admitting Provider Note  Report given to: 05/09/20 RN  Additional Notes:

## 2020-05-07 NOTE — Progress Notes (Addendum)
Pharmacy Antibiotic Note  Brady Mckinney is a 29 y.o. male admitted on 05/07/2020 with IAI.  Pharmacy has been consulted for cipro dosing.  Plan: Cipro 400 mg IV q 12 hours  Metronidazole 500 mg iv q 8 h per md  Will sign off and follow remotely     Temp (24hrs), Avg:98.6 F (37 C), Min:98.2 F (36.8 C), Max:99.4 F (37.4 C)  Recent Labs  Lab 05/06/20 2242  WBC 6.3  CREATININE 0.76    CrCl cannot be calculated (Unknown ideal weight.).    No Known Allergies   Thank you for allowing pharmacy to be a part of this patient's care.  Luisa Hart D 05/07/2020 6:22 AM

## 2020-05-07 NOTE — Progress Notes (Signed)
RN agrees with previous nurse assessment. Rn will continue to monitor patient. Patient is in no acute distress at this time.

## 2020-05-07 NOTE — Progress Notes (Signed)
Patient was found smoking in the room with the window open. Cigarettes were taking and counted (#18) and packed with a lighter in a sealed bag and placed in patients shadow chart at the nurses station. Patient also had home prescriptions with him which were all sent to the pharmacy. Form has been placed in the chart.

## 2020-05-08 DIAGNOSIS — R7401 Elevation of levels of liver transaminase levels: Secondary | ICD-10-CM

## 2020-05-08 DIAGNOSIS — F259 Schizoaffective disorder, unspecified: Secondary | ICD-10-CM

## 2020-05-08 DIAGNOSIS — F1010 Alcohol abuse, uncomplicated: Secondary | ICD-10-CM

## 2020-05-08 DIAGNOSIS — E876 Hypokalemia: Secondary | ICD-10-CM

## 2020-05-08 DIAGNOSIS — K529 Noninfective gastroenteritis and colitis, unspecified: Secondary | ICD-10-CM

## 2020-05-08 LAB — GASTROINTESTINAL PANEL BY PCR, STOOL (REPLACES STOOL CULTURE)

## 2020-05-08 LAB — COMPREHENSIVE METABOLIC PANEL
ALT: 181 U/L — ABNORMAL HIGH (ref 0–44)
AST: 230 U/L — ABNORMAL HIGH (ref 15–41)
Albumin: 3.5 g/dL (ref 3.5–5.0)
Alkaline Phosphatase: 112 U/L (ref 38–126)
Anion gap: 6 (ref 5–15)
BUN: <5 mg/dL — ABNORMAL LOW (ref 6–20)
CO2: 26 mmol/L (ref 22–32)
Calcium: 8.7 mg/dL — ABNORMAL LOW (ref 8.9–10.3)
Chloride: 103 mmol/L (ref 98–111)
Creatinine, Ser: 0.73 mg/dL (ref 0.61–1.24)
GFR calc Af Amer: >60 mL/min (ref >60)
GFR calc non Af Amer: >60 mL/min (ref >60)
Glucose, Bld: 89 mg/dL (ref 70–99)
Potassium: 4 mmol/L (ref 3.5–5.1)
Sodium: 135 mmol/L (ref 135–145)
Total Bilirubin: 1 mg/dL (ref 0.3–1.2)
Total Protein: 6 g/dL — ABNORMAL LOW (ref 6.5–8.1)

## 2020-05-08 MED ORDER — PANTOPRAZOLE SODIUM 40 MG PO TBEC
40.0000 mg | DELAYED_RELEASE_TABLET | Freq: Two times a day (BID) | ORAL | Status: DC
  Administered 2020-05-08 – 2020-05-09 (×2): 40 mg via ORAL
  Filled 2020-05-08 (×2): qty 1

## 2020-05-08 NOTE — Plan of Care (Signed)
  Problem: Education: Goal: Knowledge of General Education information will improve Description: Including pain rating scale, medication(s)/side effects and non-pharmacologic comfort measures Outcome: Progressing   Problem: Health Behavior/Discharge Planning: Goal: Ability to manage health-related needs will improve Outcome: Progressing   Problem: Clinical Measurements: Goal: Ability to maintain clinical measurements within normal limits will improve Outcome: Progressing   Problem: Clinical Measurements: Goal: Will remain free from infection Outcome: Progressing   Problem: Clinical Measurements: Goal: Diagnostic test results will improve Outcome: Progressing   Problem: Elimination: Goal: Will not experience complications related to bowel motility Outcome: Progressing

## 2020-05-08 NOTE — Progress Notes (Signed)

## 2020-05-08 NOTE — Progress Notes (Signed)
PROGRESS NOTE    Brady Mckinney  NWG:[id number] DOB: [birth year] DOA: 05/07/2020 PCP: Treasa School, PA-C   Brief Narrative: Brady Mckinney is a 29 y.o. male with history of  alcohol abuse, bipolar 1 disorder, schizoaffective disorder. Patient presented secondary to nausea/vomiting for two weeks with evidence of proctocolitis on imaging. Antibiotics and IV fluids initiated. GI consulted for co-management.   Assessment & Plan:   Principal Problem:   Colitis Active Problems:   Alcohol abuse   Schizoaffective disorder (HCC)   Hypokalemia   Transaminitis   Proctocolitis C. Difficile negative. GI pathogen panel pending. GI consulted and are following. Patient still with some nausea and abdominal pain. Had some issue tolerating oral fluids this morning with nausea. IBD is in differential, however, GI believes more likely infectious colitis. -Continue Ciprofloxacin/Flagyl -Advance diet as tolerated -GI recommendations -Follow-up GI pathogen panel  Elevated AST/ALT Possibly in setting of alcohol use. Improved but not baseline.  Alcohol abuse Last drink was day before discharge per chart review. Started on CIWA protocol -Continue CIWA  Bipolar 1 disorder Schizoaffective disorder -Continue Tegretol  Hypokalemia Mild. Resolved with supplementation.  Tobacco use -Continue nicotine patch  DVT prophylaxis: Lovenox Code Status:   Code Status: Prior Family Communication: None at bedside Disposition Plan: Discharge home pending improvement of nausea/vomiting, ability to tolerate more solid diet   Consultants:   [facility]  Procedures:   None  Antimicrobials:  Ciprofloxacin  Flagyl    Subjective: Abdominal pain. Significant nausea with oral intake  Objective: Vitals:   05/07/20 1408 05/07/20 1801 05/07/20 2118 05/08/20 0454  BP: 121/86 (!) 144/102 106/75 120/85  Pulse: 79 95 83 77  Resp: 18 20 20 20   Temp: 98.7 F (37.1 C) 98.2 F (36.8 C) 98.4 F (36.9  C) 98 F (36.7 C)  TempSrc: Oral Oral Oral Oral  SpO2: 98% 100% 99% 98%    Intake/Output Summary (Last 24 hours) at 05/08/2020 1059 Last data filed at 05/07/2020 1615 Gross per 24 hour  Intake 1012.38 ml  Output --  Net 1012.38 ml   Filed Weights    Examination:  General exam: Appears calm and comfortable Respiratory system: Clear to auscultation. Respiratory effort normal. Cardiovascular system: S1 & S2 heard, RRR. No murmurs, rubs, gallops or clicks. Gastrointestinal system: Abdomen is nondistended, soft and tender on deep palpation only. No organomegaly or masses felt. Normal bowel sounds heard. Central nervous system: Alert and oriented. No focal neurological deficits. Musculoskeletal: No edema. No calf tenderness Skin: No cyanosis. No rashes Psychiatry: Judgement and insight appear normal. Mood & affect appropriate.     Data Reviewed: I have personally reviewed following labs and imaging studies  CBC Lab Results  Component Value Date   WBC 4.6 05/07/2020   RBC 4.62 05/07/2020   HGB 14.1 05/07/2020   HCT 42.3 05/07/2020   MCV 91.6 05/07/2020   MCH 30.5 05/07/2020   PLT 171 05/07/2020   MCHC 33.3 05/07/2020   RDW 13.7 05/07/2020     Last metabolic panel Lab Results  Component Value Date   NA 135 05/08/2020   K 4.0 05/08/2020   CL 103 05/08/2020   CO2 26 05/08/2020   BUN <5 (L) 05/08/2020   CREATININE 0.73 05/08/2020   GLUCOSE 89 05/08/2020   GFRNONAA >60 05/08/2020   GFRAA >60 05/08/2020   CALCIUM 8.7 (L) 05/08/2020   PHOS 3.6 05/07/2020   PROT 6.0 (L) 05/08/2020   ALBUMIN 3.5 05/08/2020   BILITOT 1.0 05/08/2020   ALKPHOS 112  05/08/2020   AST 230 (H) 05/08/2020   ALT 181 (H) 05/08/2020   ANIONGAP 6 05/08/2020    CBG (last 3)  No results for input(s): GLUCAP in the last 72 hours.   GFR: CrCl cannot be calculated (Unknown ideal weight.).  Coagulation Profile: No results for input(s): INR, PROTIME in the last 168 hours.  Recent Results  (from the past 240 hour(s))  SARS Coronavirus 2 by RT PCR (hospital order, performed in [facility] hospital lab) Nasopharyngeal Nasopharyngeal Swab     Status: None   Collection Time: 05/07/20  5:30 AM   Specimen: Nasopharyngeal Swab  Result Value Ref Range Status   SARS Coronavirus 2 NEGATIVE NEGATIVE Final    Comment: (NOTE) SARS-CoV-2 target nucleic acids are NOT DETECTED. The SARS-CoV-2 RNA is generally detectable in upper and lower respiratory specimens during the acute phase of infection. The lowest concentration of SARS-CoV-2 viral copies this assay can detect is 250 copies / mL. A negative result does not preclude SARS-CoV-2 infection and should not be used as the sole basis for treatment or other patient management decisions.  A negative result may occur with improper specimen collection / handling, submission of specimen other than nasopharyngeal swab, presence of viral mutation(s) within the areas targeted by this assay, and inadequate number of viral copies (<250 copies / mL). A negative result must be combined with clinical observations, patient history, and epidemiological information. Fact Sheet for Patients:   StrictlyIdeas.no Fact Sheet for Healthcare Providers: BankingDealers.co.za This test is not yet approved or cleared  by the [place] FDA and has been authorized for detection and/or diagnosis of SARS-CoV-2 by FDA under an Emergency Use Authorization (EUA).  This EUA will remain in effect (meaning this test can be used) for the duration of the COVID-19 declaration under Section 564(b)(1) of the Act, 21 U.S.C. section 360bbb-3(b)(1), unless the authorization is terminated or revoked sooner. Performed at [facility], [place] [address], [place], [place] [address]   C Difficile Quick Screen w PCR reflex     Status: None   Collection Time: 05/07/20  3:20 PM   Specimen: Stool  Result Value Ref  Range Status   C Diff antigen NEGATIVE NEGATIVE Final   C Diff toxin NEGATIVE NEGATIVE Final   C Diff interpretation No C. difficile detected.  Final    Comment: Performed at [facility], [place] [address], [place], [place] [address]        Radiology Studies: CT Abdomen Pelvis W Contrast  Result Date: 05/07/2020 CLINICAL DATA:  Abdominal pain, nausea and vomiting for 2 weeks EXAM: CT ABDOMEN AND PELVIS WITH CONTRAST TECHNIQUE: Multidetector CT imaging of the abdomen and pelvis was performed using the standard protocol following bolus administration of intravenous contrast. CONTRAST:  167mL OMNIPAQUE IOHEXOL 300 MG/ML  SOLN COMPARISON:  None. FINDINGS: Lower chest: Lung bases are clear. Normal heart size. No pericardial effusion. Hepatobiliary: Diffuse hepatic hypoattenuation compatible with hepatic steatosis. Some mild spurring along the gallbladder fossa. No focal concerning liver lesion. Smooth liver surface contour. Pancreas: Unremarkable. No pancreatic ductal dilatation or surrounding inflammatory changes. Spleen: Normal in size without focal abnormality. Adrenals/Urinary Tract: Normal adrenal glands Kidneys are normally located with symmetric enhancement. No suspicious renal lesion, urolithiasis or hydronephrosis. There is mild diffuse circumferential bladder wall thickening slightly greater than expected for degree of distension. Stomach/Bowel: Distal esophagus, stomach and duodenal sweep are unremarkable. Portions of the small bowel and much of the colon are fluid-filled without frank small bowel  or proximal colonic thickening. However, there is notable edematous mural thickening of the sigmoid and rectum with some mucosal hyperemia. No pneumatosis or portal venous gas. No extraluminal air or fluid. No evidence of obstruction. Normal appendix seen in the right lower quadrant. Vascular/Lymphatic: No significant vascular findings are present. No enlarged abdominal or pelvic lymph  nodes. Reproductive: Coarse calcifications seen throughout the prostate, nonspecific though typically benign. No other prostate or seminal vesicular abnormality. Other: No abdominopelvic free fluid or free gas. No bowel containing hernias. Musculoskeletal: No acute osseous abnormality or suspicious osseous lesion. IMPRESSION: 1. Fluid-filled loops of large and small bowel with more edematous changes of the sigmoid and rectum suggesting a proctocolitis, possibly infectious or inflammatory with lack of formed stool suggesting a rapid transit state. 2. Mild diffuse circumferential bladder wall thickening slightly greater than expected for degree of distension. Recommend correlation with urinalysis to exclude cystitis. 3. Hepatic steatosis. Electronically Signed   By: Kreg Shropshire M.D.   On: 05/07/2020 04:59        Scheduled Meds:  carbamazepine  100 mg Oral BID   chlorhexidine  15 mL Mouth Rinse BID   enoxaparin (LOVENOX) injection  40 mg Subcutaneous Q24H   folic acid  1 mg Oral Daily   gabapentin  300 mg Oral TID   mouth rinse  15 mL Mouth Rinse q12n4p   multivitamin with minerals  1 tablet Oral Daily   nicotine  14 mg Transdermal Daily   pantoprazole (PROTONIX) IV  40 mg Intravenous Q12H   sodium chloride flush  3 mL Intravenous Once   thiamine  100 mg Oral Daily   Or   thiamine  100 mg Intravenous Daily   traZODone  100 mg Oral QHS   Continuous Infusions:  ciprofloxacin 400 mg (05/08/20 0732)   lactated ringers with kcl 75 mL/hr at 05/08/20 0303   metronidazole 500 mg (05/08/20 0521)     LOS: 1 day     Jacquelin Hawking, MD [facility] 05/08/2020, 10:59 AM  If 7PM-7AM, please contact night-coverage [email or web address]

## 2020-05-08 NOTE — Progress Notes (Signed)
[facility] Gastroenterology Progress Note  Brady Mckinney 29 y.o. 06-08-1991  CC: Nausea, vomiting and diarrhea   Subjective: Patient seen and examined at bedside.  Resting comfortably.  Diarrhea is improved.  Continues to have some nausea and vomiting.  Was found to be smoking in the room yesterday.  ROS : Afebrile.  Negative for chest pain   Objective: Vital signs in last 24 hours: Vitals:   05/07/20 2118 05/08/20 0454  BP: 106/75 120/85  Pulse: 83 77  Resp: 20 20  Temp: 98.4 F (36.9 C) 98 F (36.7 C)  SpO2: 99% 98%    Physical Exam:  General.  Resting comfortably.  Not in acute distress Abdomen.  Mild discomfort on palpation, generalized discomfort, abdomen is soft, bowel sounds present.  No peritoneal signs Neuro.  Alert and oriented x3 Psych.  Mood and affect normal  Lab Results: Recent Labs    05/07/20 0734 05/08/20 0545  NA 135 135  K 3.4* 4.0  CL 98 103  CO2 27 26  GLUCOSE 109* 89  BUN <5* <5*  CREATININE 0.72 0.73  CALCIUM 7.9* 8.7*  MG 1.9  --   PHOS 3.6  --    Recent Labs    05/07/20 0734 05/08/20 0545  AST 221* 230*  ALT 183* 181*  ALKPHOS 120 112  BILITOT 1.2 1.0  PROT 6.1* 6.0*  ALBUMIN 3.6 3.5   Recent Labs    05/06/20 2242 05/07/20 0734  WBC 6.3 4.6  HGB 16.5 14.1  HCT 48.3 42.3  MCV 90.4 91.6  PLT 219 171   No results for input(s): LABPROT, INR in the last 72 hours.    Assessment/Plan: CT scan showed fatty liver and possible proctoscopy sigmoid colitis  Assessment ---------------- -Nausea, vomiting, bloody diarrhea.  CT scan showed proctosigmoid colitis.  Most likely infectious colitis.  Patient with intermittent symptom for 4 years so inflammatory bowel disease remains in differential. -Diarrhea improving now.  Nausea and vomiting could be from substance use. -Abnormal LFTs.  Could be from fatty liver and alcohol use.  Hepatitis panel negative.   -Urine drug screen positive for THC and  opiates.   Recommendations -------------------------- -C. difficile negative.  GI pathogen panel pending -Continue ciprofloxacin and Flagyl -Continue PPI -As needed antiemetics -Advance diet to full liquid.  Slowly advance to soft as tolerated -Recommend outpatient EGD and colonoscopy once acute issues are resolved. -Abstinence from substance use and avoidance of alcohol use discussed. -GI will follow   Kathi Der MD, FACP 05/08/2020, 10:01 AM  Contact #  [phone]

## 2020-05-08 NOTE — Plan of Care (Signed)

## 2020-05-09 LAB — COMPREHENSIVE METABOLIC PANEL
ALT: 208 U/L — ABNORMAL HIGH (ref 0–44)
AST: 264 U/L — ABNORMAL HIGH (ref 15–41)
Albumin: 3.6 g/dL (ref 3.5–5.0)
Alkaline Phosphatase: 116 U/L (ref 38–126)
Anion gap: 8 (ref 5–15)
BUN: <5 mg/dL — ABNORMAL LOW (ref 6–20)
CO2: 25 mmol/L (ref 22–32)
Calcium: 9 mg/dL (ref 8.9–10.3)
Chloride: 105 mmol/L (ref 98–111)
Creatinine, Ser: 0.76 mg/dL (ref 0.61–1.24)
GFR calc Af Amer: >60 mL/min (ref >60)
GFR calc non Af Amer: >60 mL/min (ref >60)
Glucose, Bld: 93 mg/dL (ref 70–99)
Potassium: 4.1 mmol/L (ref 3.5–5.1)
Sodium: 138 mmol/L (ref 135–145)
Total Bilirubin: 0.8 mg/dL (ref 0.3–1.2)
Total Protein: 6.1 g/dL — ABNORMAL LOW (ref 6.5–8.1)

## 2020-05-09 LAB — URINE CULTURE: Culture: NO GROWTH

## 2020-05-09 MED ORDER — CIPROFLOXACIN HCL 500 MG PO TABS: 500.0000 mg | ORAL_TABLET | Freq: Two times a day (BID) | ORAL | Status: DC

## 2020-05-09 MED ORDER — METRONIDAZOLE 500 MG PO TABS
500.0000 mg | ORAL_TABLET | Freq: Three times a day (TID) | ORAL | Status: DC
  Administered 2020-05-09: 500 mg via ORAL
  Filled 2020-05-09: qty 1

## 2020-05-09 MED ORDER — TRAMADOL HCL 50 MG PO TABS
50.0000 mg | ORAL_TABLET | Freq: Four times a day (QID) | ORAL | Status: DC | PRN
  Administered 2020-05-09: 100 mg via ORAL
  Filled 2020-05-09: qty 2

## 2020-05-09 NOTE — Progress Notes (Signed)
Patient Requested discharge or states he is going AMA.  On-Call and Supervisor updated.  Informed of financial responsibility and received signature on "Leaving Hospital Against Medical Advice." form. A/O X 4 denies pain, and obtained belongings from pharmacy. Informed Telemetry of AMA discharge.  Reports called sister who will pick him up. Will escort to exit at from lobby via W/C.

## 2020-05-09 NOTE — Progress Notes (Addendum)
PROGRESS NOTE    Brady Mckinney  EGB:[id number] DOB: 1991-05-30 DOA: 05/07/2020 PCP: Dustin Flock, PA-C   Brief Narrative: Brady Mckinney is a 29 y.o. male with history of  alcohol abuse, bipolar 1 disorder, schizoaffective disorder. Patient presented secondary to nausea/vomiting for two weeks with evidence of proctocolitis on imaging. Antibiotics and IV fluids initiated. GI consulted for co-management.   Assessment & Plan:   Principal Problem:   Colitis Active Problems:   Alcohol abuse   Schizoaffective disorder (HCC)   Hypokalemia   Transaminitis   Proctocolitis C. Difficile negative. GI pathogen panel negative. IBD is in differential, however, GI believes more likely infectious colitis. Nausea improved. -Continue Ciprofloxacin/Flagyl -Advance diet as tolerated: Soft diet today -GI recommendations:Okay for discharge -Discontinue morphine and start Tramadol today  Elevated AST/ALT Possibly in setting of alcohol use. Improved but not baseline.  Alcohol abuse Last drink was day before discharge per chart review. Started on CIWA protocol -Continue CIWA  Bipolar 1 disorder Schizoaffective disorder -Continue Tegretol  Hypokalemia Mild. Resolved with supplementation.  Tobacco use -Continue nicotine patch  DVT prophylaxis: Lovenox Code Status:   Code Status: Prior Family Communication: None at bedside Disposition Plan: Discharge home likely tomorrow pending ability to advance diet, toleration of oral analgesic regimen   Consultants:   [facility]  Procedures:   None  Antimicrobials:  Ciprofloxacin  Flagyl    Subjective: Continued abdominal pain.  Objective: Vitals:   05/08/20 1417 05/08/20 1644 05/08/20 2139 05/09/20 0500  BP: 116/76 121/86 118/68 114/76  Pulse: 71 71 75 60  Resp: 18  20   Temp: 98.2 F (36.8 C)  98.5 F (36.9 C) 97.8 F (36.6 C)  TempSrc: Oral  Oral Oral  SpO2: 98%  99% 98%    Intake/Output Summary (Last 24 hours) at  05/09/2020 1141 Last data filed at 05/09/2020 0900 Gross per 24 hour  Intake 960 ml  Output --  Net 960 ml   Filed Weights    Examination:  General exam: Appears calm and comfortable  Respiratory system: Clear to auscultation. Respiratory effort normal. Cardiovascular system: S1 & S2 heard, RRR. No murmurs, rubs, gallops or clicks. Gastrointestinal system: Abdomen is nondistended, soft and tender in left quadrants. No organomegaly or masses felt. Decreased bowel sounds heard. Central nervous system: Alert and oriented. No focal neurological deficits. Musculoskeletal: No edema. No calf tenderness Skin: No cyanosis. No rashes Psychiatry: Judgement and insight appear normal. Mood & affect appropriate.     Data Reviewed: I have personally reviewed following labs and imaging studies  CBC Lab Results  Component Value Date   WBC 4.6 05/07/2020   RBC 4.62 05/07/2020   HGB 14.1 05/07/2020   HCT 42.3 05/07/2020   MCV 91.6 05/07/2020   MCH 30.5 05/07/2020   PLT 171 05/07/2020   MCHC 33.3 05/07/2020   RDW 13.7 37/09/6268     Last metabolic panel Lab Results  Component Value Date   NA 138 05/09/2020   K 4.1 05/09/2020   CL 105 05/09/2020   CO2 25 05/09/2020   BUN <5 (L) 05/09/2020   CREATININE 0.76 05/09/2020   GLUCOSE 93 05/09/2020   GFRNONAA >60 05/09/2020   GFRAA >60 05/09/2020   CALCIUM 9.0 05/09/2020   PHOS 3.6 05/07/2020   PROT 6.1 (L) 05/09/2020   ALBUMIN 3.6 05/09/2020   BILITOT 0.8 05/09/2020   ALKPHOS 116 05/09/2020   AST 264 (H) 05/09/2020   ALT 208 (H) 05/09/2020   ANIONGAP 8 05/09/2020  CBG (last 3)  No results for input(s): GLUCAP in the last 72 hours.   GFR: CrCl cannot be calculated (Unknown ideal weight.).  Coagulation Profile: No results for input(s): INR, PROTIME in the last 168 hours.  Recent Results (from the past 240 hour(s))  SARS Coronavirus 2 by RT PCR (hospital order, performed in [facility] hospital lab) Nasopharyngeal  Nasopharyngeal Swab     Status: None   Collection Time: 05/07/20  5:30 AM   Specimen: Nasopharyngeal Swab  Result Value Ref Range Status   SARS Coronavirus 2 NEGATIVE NEGATIVE Final    Comment: (NOTE) SARS-CoV-2 target nucleic acids are NOT DETECTED. The SARS-CoV-2 RNA is generally detectable in upper and lower respiratory specimens during the acute phase of infection. The lowest concentration of SARS-CoV-2 viral copies this assay can detect is 250 copies / mL. A negative result does not preclude SARS-CoV-2 infection and should not be used as the sole basis for treatment or other patient management decisions.  A negative result may occur with improper specimen collection / handling, submission of specimen other than nasopharyngeal swab, presence of viral mutation(s) within the areas targeted by this assay, and inadequate number of viral copies (<250 copies / mL). A negative result must be combined with clinical observations, patient history, and epidemiological information. Fact Sheet for Patients:   BoilerBrush.com.cy Fact Sheet for Healthcare Providers: https://pope.com/ This test is not yet approved or cleared  by the [place] FDA and has been authorized for detection and/or diagnosis of SARS-CoV-2 by FDA under an Emergency Use Authorization (EUA).  This EUA will remain in effect (meaning this test can be used) for the duration of the COVID-19 declaration under Section 564(b)(1) of the Act, 21 U.S.C. section 360bbb-3(b)(1), unless the authorization is terminated or revoked sooner. Performed at [facility], [address], [place], [place] [address]   Urine culture     Status: None   Collection Time: 05/07/20  1:31 PM   Specimen: Urine, Clean Catch  Result Value Ref Range Status   Specimen Description   Final    URINE, CLEAN CATCH Performed at [facility], [address],  [place], [place] [address]    Special Requests   Final    NONE Performed at [facility], [address], [place], [place] [address]    Culture   Final    NO GROWTH Performed at [facility], [address] [place], [place] [address]    Report Status 05/09/2020 FINAL  Final  Gastrointestinal Panel by PCR , Stool     Status: None   Collection Time: 05/07/20  3:20 PM   Specimen: Stool  Result Value Ref Range Status   Campylobacter species NOT DETECTED NOT DETECTED Final   Plesimonas shigelloides NOT DETECTED NOT DETECTED Final   Salmonella species NOT DETECTED NOT DETECTED Final   Yersinia enterocolitica NOT DETECTED NOT DETECTED Final   Vibrio species NOT DETECTED NOT DETECTED Final   Vibrio cholerae NOT DETECTED NOT DETECTED Final   Enteroaggregative E coli (EAEC) NOT DETECTED NOT DETECTED Final   Enteropathogenic E coli (EPEC) NOT DETECTED NOT DETECTED Final   Enterotoxigenic E coli (ETEC) NOT DETECTED NOT DETECTED Final   Shiga like toxin producing E coli (STEC) NOT DETECTED NOT DETECTED Final   Shigella/Enteroinvasive E coli (EIEC) NOT DETECTED NOT DETECTED Final   Cryptosporidium NOT DETECTED NOT DETECTED Final   Cyclospora cayetanensis NOT DETECTED NOT DETECTED Final   Entamoeba histolytica NOT DETECTED  NOT DETECTED Final   Giardia lamblia NOT DETECTED NOT DETECTED Final   Adenovirus F40/41 NOT DETECTED NOT DETECTED Final   Astrovirus NOT DETECTED NOT DETECTED Final   Norovirus GI/GII NOT DETECTED NOT DETECTED Final   Rotavirus A NOT DETECTED NOT DETECTED Final   Sapovirus (I, II, IV, and V) NOT DETECTED NOT DETECTED Final    Comment: Performed at [facility], [address] [place], [place] [address]  C Difficile Quick Screen w PCR reflex     Status: None   Collection Time: 05/07/20  3:20 PM   Specimen: Stool  Result Value Ref Range Status   C Diff antigen NEGATIVE NEGATIVE Final   C Diff toxin NEGATIVE NEGATIVE Final   C Diff  interpretation No C. difficile detected.  Final    Comment: Performed at [facility], [address], [place], [place] [address]        Radiology Studies: No results found.      Scheduled Meds: . carbamazepine  100 mg Oral BID  . chlorhexidine  15 mL Mouth Rinse BID  . enoxaparin (LOVENOX) injection  40 mg Subcutaneous Q24H  . folic acid  1 mg Oral Daily  . gabapentin  300 mg Oral TID  . mouth rinse  15 mL Mouth Rinse q12n4p  . multivitamin with minerals  1 tablet Oral Daily  . nicotine  14 mg Transdermal Daily  . pantoprazole  40 mg Oral BID  . thiamine  100 mg Oral Daily   Or  . thiamine  100 mg Intravenous Daily  . traZODone  100 mg Oral QHS   Continuous Infusions: . ciprofloxacin 400 mg (05/09/20 0716)  . lactated ringers with kcl 75 mL/hr at 05/08/20 2125  . metronidazole 500 mg (05/09/20 0523)     LOS: 2 days     Jacquelin Hawking, MD [facility] 05/09/2020, 11:41 AM  If 7PM-7AM, please contact night-coverage [email or web address]

## 2020-05-09 NOTE — Progress Notes (Signed)
[facility] Gastroenterology Progress Note  Brady Mckinney 29 y.o. 08-17-1991  CC: Nausea, vomiting and diarrhea   Subjective: Patient seen and examined at bedside.  Feeling much better today.  Nausea and vomiting has resolved.  Diarrhea improving .  Continues to have mild abdominal discomfort  ROS : Afebrile.  Negative for chest pain   Objective: Vital signs in last 24 hours: Vitals:   05/08/20 2139 05/09/20 0500  BP: 118/68 114/76  Pulse: 75 60  Resp: 20   Temp: 98.5 F (36.9 C) 97.8 F (36.6 C)  SpO2: 99% 98%    Physical Exam:  General.  Resting comfortably.  Not in acute distress Abdomen.  Mild discomfort on palpation of the left lower quadrant, , abdomen is soft, bowel sounds present.  No peritoneal signs Neuro.  Alert and oriented x3 Psych.  Mood and affect normal  Lab Results: Recent Labs    05/07/20 0734 05/07/20 0734 05/08/20 0545 05/09/20 0547  NA 135   < > 135 138  K 3.4*   < > 4.0 4.1  CL 98   < > 103 105  CO2 27   < > 26 25  GLUCOSE 109*   < > 89 93  BUN <5*   < > <5* <5*  CREATININE 0.72   < > 0.73 0.76  CALCIUM 7.9*   < > 8.7* 9.0  MG 1.9  --   --   --   PHOS 3.6  --   --   --    < > = values in this interval not displayed.   Recent Labs    05/08/20 0545 05/09/20 0547  AST 230* 264*  ALT 181* 208*  ALKPHOS 112 116  BILITOT 1.0 0.8  PROT 6.0* 6.1*  ALBUMIN 3.5 3.6   Recent Labs    05/06/20 2242 05/07/20 0734  WBC 6.3 4.6  HGB 16.5 14.1  HCT 48.3 42.3  MCV 90.4 91.6  PLT 219 171   No results for input(s): LABPROT, INR in the last 72 hours.   CT scan showed fatty liver and possible proctoscopy sigmoid colitis  Assessment/Plan:   -Nausea, vomiting, bloody diarrhea.  CT scan showed proctosigmoid colitis.  Most likely infectious colitis.  Patient with intermittent symptom for 4 years so inflammatory bowel disease remains in differential. -Diarrhea improving now.  Nausea and vomiting could be from substance use.  Nausea and vomiting  resolved now. -Abnormal LFTs.  Could be from fatty liver and alcohol use.  Hepatitis panel negative.   -Urine drug screen positive for THC and opiates.   Recommendations -------------------------- -C. difficile negative.  GI pathogen panel also negative. -Continue ciprofloxacin and Flagyl for another 10 days -Continue PPI 40 mg once a day -As needed antiemetics -Advance diet to soft -Recommend outpatient EGD and colonoscopy once acute issues are resolved. -Abstinence from substance use and avoidance of alcohol use discussed.  -Okay to discharge from GI standpoint.  Follow-up in GI clinic in 4 weeks to set up outpatient EGD and colonoscopy as well as for follow-up on abnormal LFTs.  -GI will sign off.  Call us back if needed   Kathi Der MD, FACP 05/09/2020, 9:58 AM  Contact #  [phone]

## 2020-05-09 NOTE — TOC Initial Note (Signed)
Transition of Care [facility]) - Initial/Assessment Note    Patient Details  Name: Brady Mckinney MRN: [id number] Date of Birth: 03/23/1991  [facility] CM/SW Contact:    Trish Mage, LCSW Phone Number: 05/09/2020, 4:30 PM  Clinical Narrative:  Met with patient in follow up to MD consult re: substance abuse.  When asked if he has concerns for d/c, Mr Sprung states she is worried about his hospital bill as he currently has no income, and that he is homeless.  He has a tent and sleep bag, [place] in the woods and has been in that situation for the past month, though he has essentially been homeless since Thanksgiving of 2019.  Previous to the last month, he had gone to [facility] rehab for 30 days, graduated, and from there was at [facility] [place]  He left there after 6 weeks before he completed that program.  "It had to do with a girl."  Kicking himself for making a poor choice.  He does not want to go back to [facility], and says he cannot get into the local shelter until he has an idea.  His appointment for getting on ID is on the 15th. I gave him the name and number of a gentleman who operates the half way house "[facility]"  here in [place]; encouraged him to call to see if he might be able to get in there. TOC will continue to follow during the course of hospitalization.                 Expected Discharge Plan: Home/Self Care Barriers to Discharge: No Barriers Identified   Patient Goals and CMS Choice Patient states their goals for this hospitalization and ongoing recovery are:: "I'm homeless"      Expected Discharge Plan and Services Expected Discharge Plan: Home/Self Care In-house Referral: Clinical Social Work     Living arrangements for the past 2 months: Homeless                                      Prior Living Arrangements/Services Living arrangements for the past 2 months: Homeless Lives with:: Self Patient language and need for  interpreter reviewed:: Yes Do you feel safe going back to the place where you live?: Yes      Need for Family Participation in Patient Care: No (Comment) Care giver support system in place?: No (comment)   Criminal Activity/Legal Involvement Pertinent to Current Situation/Hospitalization: No - Comment as needed  Activities of Daily Living Home Assistive Devices/Equipment: None ADL Screening (condition at time of admission) Patient's cognitive ability adequate to safely complete daily activities?: Yes Is the patient deaf or have difficulty hearing?: No Does the patient have difficulty seeing, even when wearing glasses/contacts?: No Does the patient have difficulty concentrating, remembering, or making decisions?: No Patient able to express need for assistance with ADLs?: Yes Does the patient have difficulty dressing or bathing?: No Independently performs ADLs?: Yes (appropriate for developmental age) Does the patient have difficulty walking or climbing stairs?: No Weakness of Legs: None Weakness of Arms/Hands: None  Permission Sought/Granted                  Emotional Assessment Appearance:: Appears stated age Attitude/Demeanor/Rapport: Engaged Affect (typically observed): Blunt Orientation: : Oriented to Self, Oriented to Place, Oriented to  Time, Oriented to Situation Alcohol / Substance Use: Alcohol Use  Psych Involvement: No (comment)  Admission diagnosis:  Proctocolitis [K52.9] Hypokalemia [E87.6] Colitis [K52.9] Elevated LFTs [R79.89] Patient Active Problem List   Diagnosis Date Noted  . Colitis 05/07/2020  . Schizoaffective disorder ([place]) 05/07/2020  . Hypokalemia 05/07/2020  . Transaminitis 05/07/2020  . Alcohol abuse 01/20/2019  . MDD (major depressive disorder), severe ([place]) 01/17/2019   PCP:  Dustin Flock, PA-C Pharmacy:   [facility] [place], [address] [address] Phone: [phone] Fax: [phone]  [place], [place] [address] Phone: [phone] Fax: [phone]     Social Determinants of Health (SDOH) Interventions    Readmission Risk Interventions No flowsheet data found.

## 2020-05-20 NOTE — Discharge Summary (Signed)
BRIEF DISCHARGE SUMMARY  PATIENT LEFT AMA  Brief Narrative: Brady Mckinney is a 29 y.o. malewith historyofalcohol abuse, bipolar 1 disorder, schizoaffective disorder. Patient presented secondary to nausea/vomiting for two weeks with evidence of proctocolitis on imaging. Antibiotics and IV fluids initiated. GI consulted for co-management.  Proctocolitis C. Difficile negative. GI pathogen panel negative. IBD is in differential, however, GI believes more likely infectious colitis. Nausea improved. Plan was to discharge patient with antibiotics but he left overnight the day prior to anticipated discharge.  Elevated AST/ALT Possibly in setting of alcohol use. Improved but not baseline.  Alcohol abuse Last drink was day before discharge per chart review. Started on CIWA protocol  Bipolar 1 disorder Schizoaffective disorder Continue Tegretol  Hypokalemia Mild. Resolved with supplementation.  Tobacco use Continue nicotine patch  Jacquelin Hawking, MD [facility] 05/20/2020, 4:53 PM

## 2021-11-30 IMAGING — CT CT ABD-PELV W/ CM
2 of 4 series · 15 of 46 positions shown, 17 images · IV contrast (omnipaque)
Comparison: None.

CLINICAL DATA: Abdominal pain, nausea and vomiting for 2 weeks

EXAM:
CT ABDOMEN AND PELVIS WITH CONTRAST
TECHNIQUE: Multidetector CT imaging of the abdomen and pelvis was performed
using the standard protocol following bolus administration of
intravenous contrast.
CONTRAST:  100mL OMNIPAQUE IOHEXOL 300 MG/ML  SOLN

[Series 2: axial st · axial · 0.71mm/px · z∈[+1081,+1481]mm · 12 of 90 slices shown, 14 images]
[im 5/90  soft-tissue]
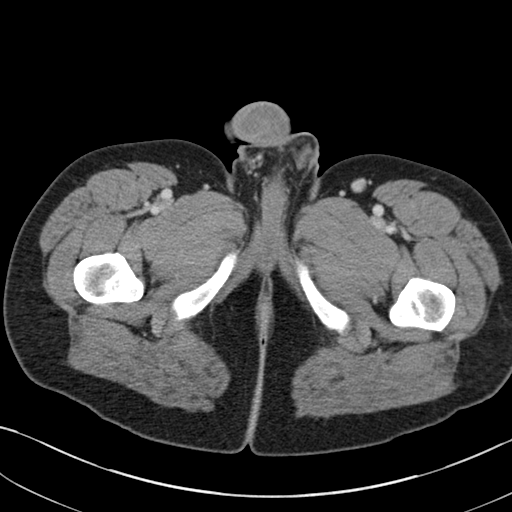
[im 5/90  bone]
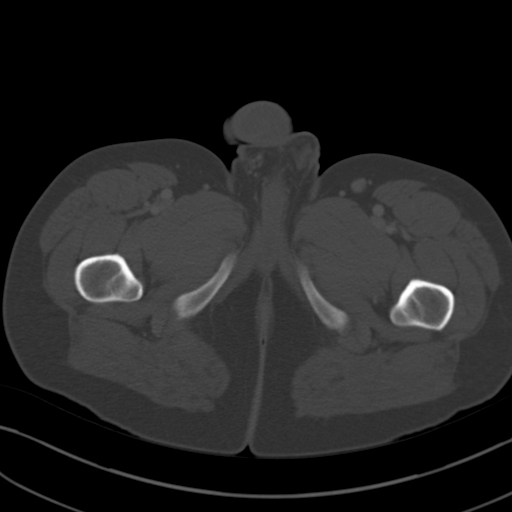
[im 14/90  soft-tissue]
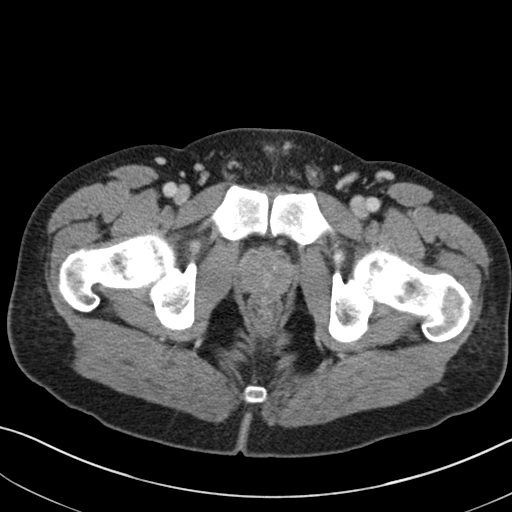
[im 18/90  soft-tissue]
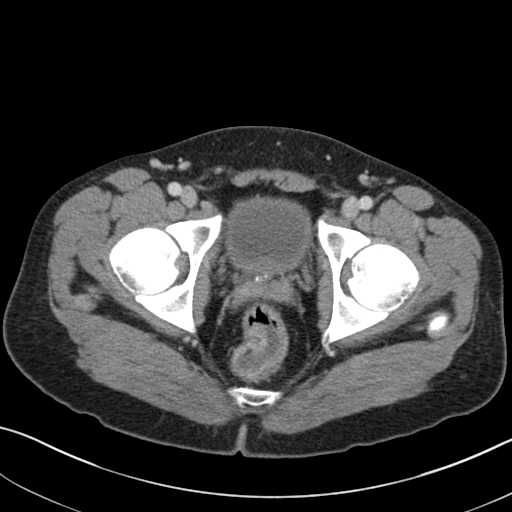
[im 27/90  soft-tissue]
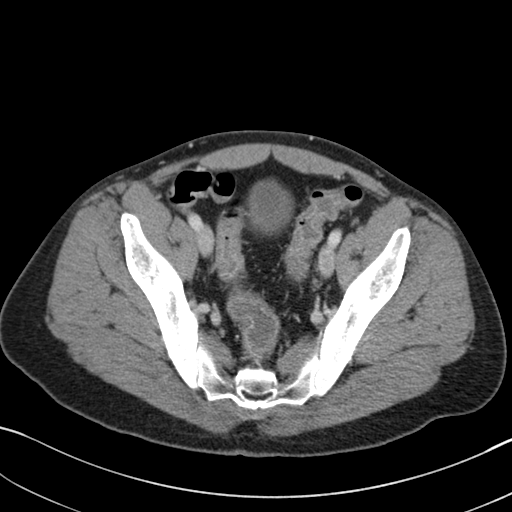
[im 36/90  soft-tissue]
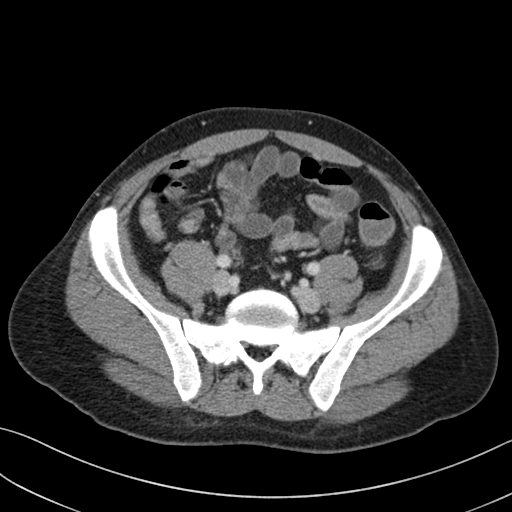
[im 41/90  soft-tissue]
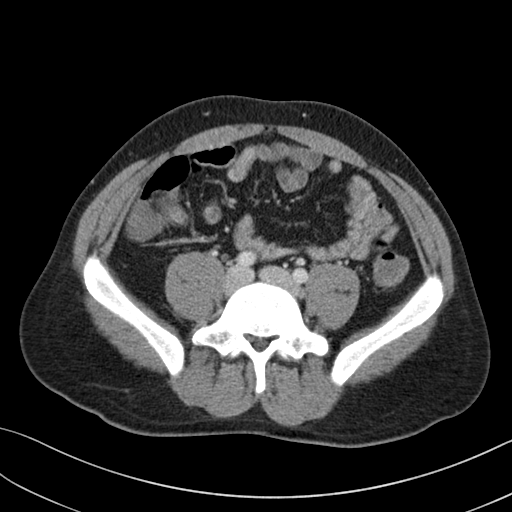
[im 49/90  soft-tissue]
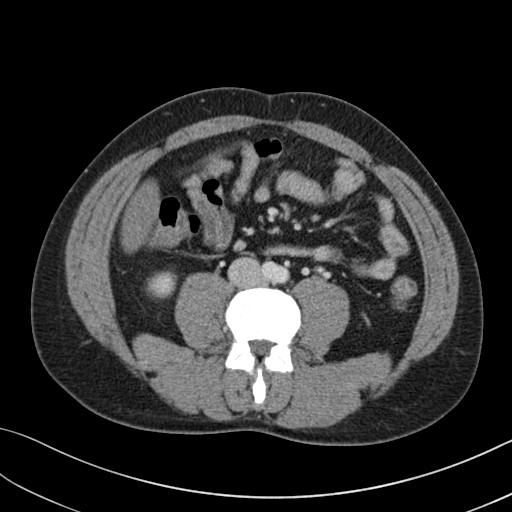
[im 54/90  soft-tissue]
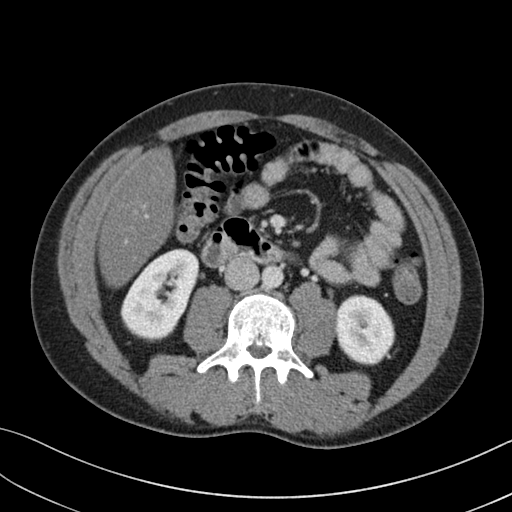
[im 63/90  soft-tissue]
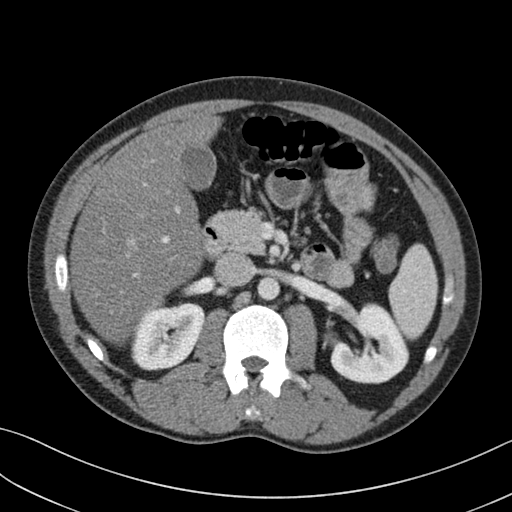
[im 63/90  bone]
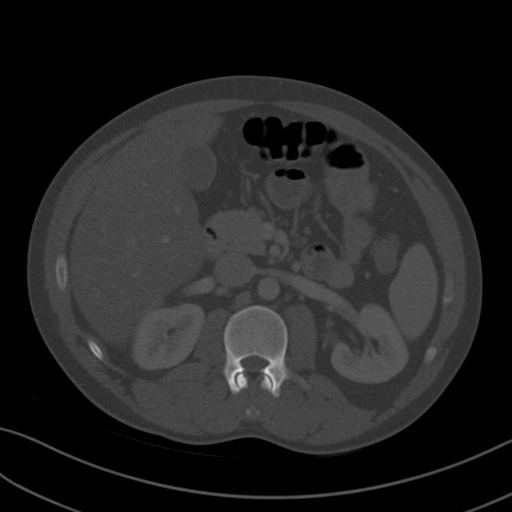
[im 72/90  soft-tissue]
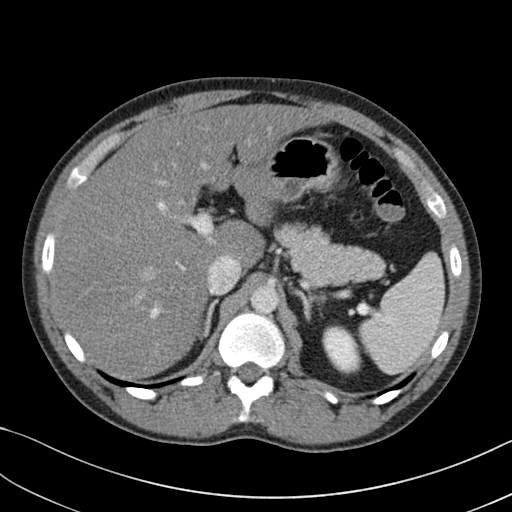
[im 76/90  soft-tissue]
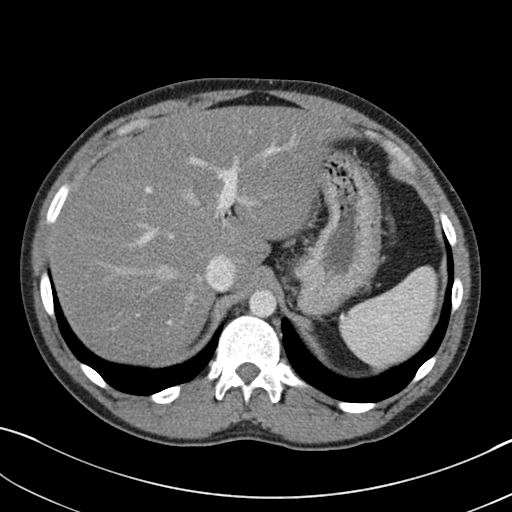
[im 85/90  soft-tissue]
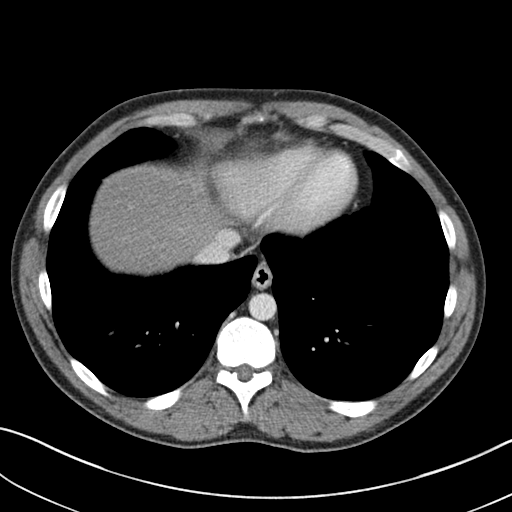

[Series 4: coronal st · coronal · 0.68mm/px · 3 of 120 slices shown]
[im 40/120  soft-tissue]
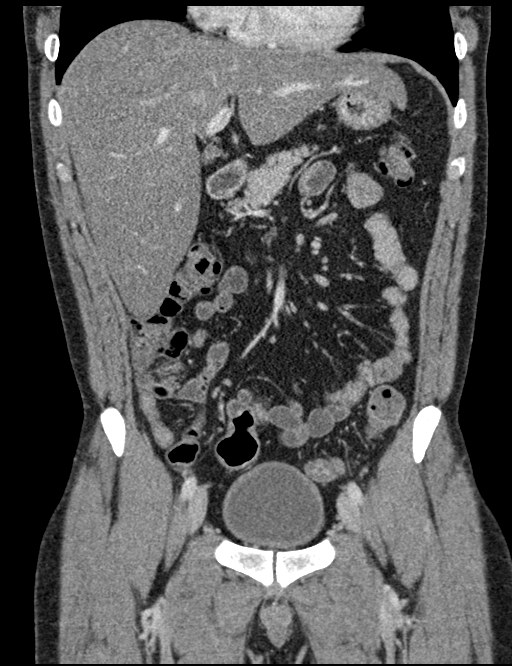
[im 53/120  soft-tissue]
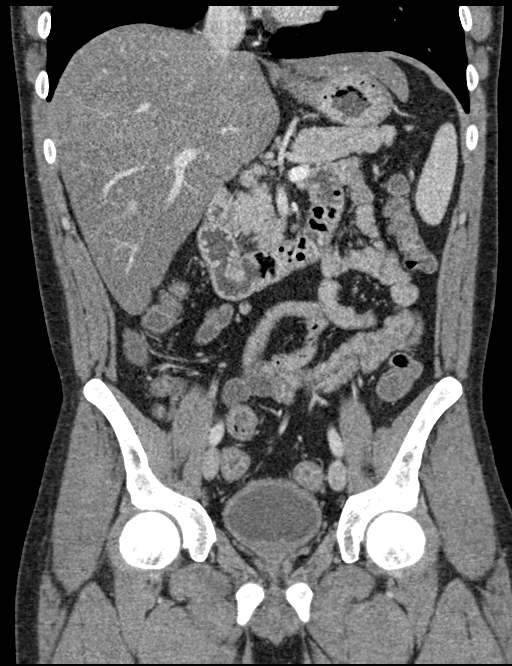
[im 67/120  soft-tissue]
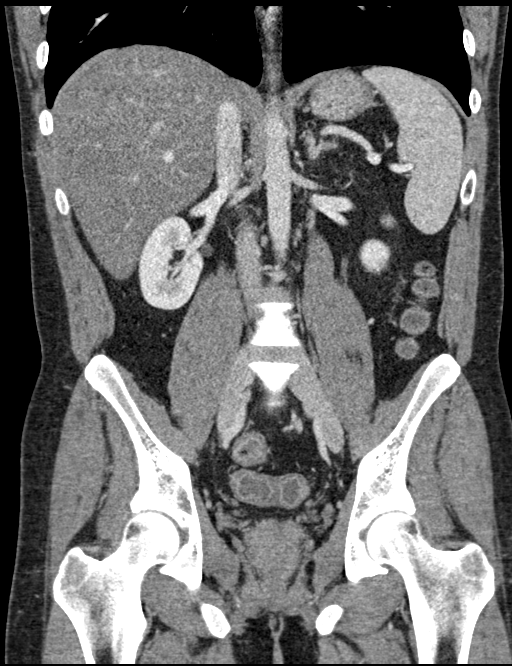

[15 of 46 positions shown; findings below may reference images not displayed]

FINDINGS: Lower chest: Lung bases are clear. Normal heart size. No pericardial
effusion.

Hepatobiliary: Diffuse hepatic hypoattenuation compatible with
hepatic steatosis. Some mild spurring along the gallbladder fossa.
No focal concerning liver lesion. Smooth liver surface contour.

Pancreas: Unremarkable. No pancreatic ductal dilatation or
surrounding inflammatory changes.

Spleen: Normal in size without focal abnormality.

Adrenals/Urinary Tract: Normal adrenal glands Kidneys are normally
located with symmetric enhancement. No suspicious renal lesion,
urolithiasis or hydronephrosis. There is mild diffuse
circumferential bladder wall thickening slightly greater than
expected for degree of distension.

Stomach/Bowel: Distal esophagus, stomach and duodenal sweep are
unremarkable. Portions of the small bowel and much of the colon are
fluid-filled without frank small bowel or proximal colonic
thickening. However, there is notable edematous mural thickening of
the sigmoid and rectum with some mucosal hyperemia. No pneumatosis
or portal venous gas. No extraluminal air or fluid. No evidence of
obstruction. Normal appendix seen in the right lower quadrant.

Vascular/Lymphatic: No significant vascular findings are present. No
enlarged abdominal or pelvic lymph nodes.

Reproductive: Coarse calcifications seen throughout the prostate,
nonspecific though typically benign. No other prostate or seminal
vesicular abnormality.

Other: No abdominopelvic free fluid or free gas. No bowel containing
hernias.

Musculoskeletal: No acute osseous abnormality or suspicious osseous
lesion.
IMPRESSION: 1. Fluid-filled loops of large and small bowel with more edematous
changes of the sigmoid and rectum suggesting a proctocolitis,
possibly infectious or inflammatory with lack of formed stool
suggesting a rapid transit state.
2. Mild diffuse circumferential bladder wall thickening slightly
greater than expected for degree of distension. Recommend
correlation with urinalysis to exclude cystitis.
3. Hepatic steatosis.
